# Patient Record
Sex: Female | Born: 1975 | Race: Black or African American | Hispanic: No | Marital: Single | Smoking: Current every day smoker
Health system: Southern US, Community
[De-identification: ages and names within clinical notes are randomized; demographics above are authoritative.]

## PROBLEM LIST (undated history)

## (undated) DIAGNOSIS — J45909 Unspecified asthma, uncomplicated: Secondary | ICD-10-CM

---

## 1998-01-06 ENCOUNTER — Emergency Department (HOSPITAL_COMMUNITY): Admission: EM | Admit: 1998-01-06 | Discharge: 1998-01-07 | Payer: Self-pay | Admitting: Internal Medicine

## 1999-11-23 ENCOUNTER — Emergency Department (HOSPITAL_COMMUNITY): Admission: EM | Admit: 1999-11-23 | Discharge: 1999-11-23 | Payer: Self-pay | Admitting: Emergency Medicine

## 1999-11-23 ENCOUNTER — Encounter: Payer: Self-pay | Admitting: Emergency Medicine

## 2000-10-06 ENCOUNTER — Encounter: Payer: Self-pay | Admitting: Emergency Medicine

## 2000-10-06 ENCOUNTER — Emergency Department (HOSPITAL_COMMUNITY): Admission: EM | Admit: 2000-10-06 | Discharge: 2000-10-07 | Payer: Self-pay | Admitting: Unknown Physician Specialty

## 2000-10-08 ENCOUNTER — Emergency Department (HOSPITAL_COMMUNITY): Admission: EM | Admit: 2000-10-08 | Discharge: 2000-10-08 | Payer: Self-pay | Admitting: Emergency Medicine

## 2001-05-15 ENCOUNTER — Emergency Department (HOSPITAL_COMMUNITY): Admission: EM | Admit: 2001-05-15 | Discharge: 2001-05-15 | Payer: Self-pay | Admitting: Emergency Medicine

## 2001-05-15 ENCOUNTER — Encounter: Payer: Self-pay | Admitting: Emergency Medicine

## 2002-06-30 ENCOUNTER — Emergency Department (HOSPITAL_COMMUNITY): Admission: EM | Admit: 2002-06-30 | Discharge: 2002-07-01 | Payer: Self-pay | Admitting: Emergency Medicine

## 2002-11-25 ENCOUNTER — Inpatient Hospital Stay (HOSPITAL_COMMUNITY): Admission: AD | Admit: 2002-11-25 | Discharge: 2002-11-25 | Payer: Self-pay | Admitting: Family Medicine

## 2002-11-26 ENCOUNTER — Encounter: Payer: Self-pay | Admitting: Family Medicine

## 2002-12-06 ENCOUNTER — Inpatient Hospital Stay (HOSPITAL_COMMUNITY): Admission: AD | Admit: 2002-12-06 | Discharge: 2002-12-06 | Payer: Self-pay | Admitting: *Deleted

## 2003-04-21 ENCOUNTER — Inpatient Hospital Stay (HOSPITAL_COMMUNITY): Admission: AD | Admit: 2003-04-21 | Discharge: 2003-04-22 | Payer: Self-pay | Admitting: Family Medicine

## 2003-09-29 ENCOUNTER — Emergency Department (HOSPITAL_COMMUNITY): Admission: EM | Admit: 2003-09-29 | Discharge: 2003-09-29 | Payer: Self-pay | Admitting: Emergency Medicine

## 2003-11-27 ENCOUNTER — Inpatient Hospital Stay (HOSPITAL_COMMUNITY): Admission: AD | Admit: 2003-11-27 | Discharge: 2003-11-27 | Payer: Self-pay | Admitting: *Deleted

## 2004-07-19 ENCOUNTER — Emergency Department (HOSPITAL_COMMUNITY): Admission: EM | Admit: 2004-07-19 | Discharge: 2004-07-19 | Payer: Self-pay | Admitting: Emergency Medicine

## 2004-12-30 ENCOUNTER — Emergency Department (HOSPITAL_COMMUNITY): Admission: EM | Admit: 2004-12-30 | Discharge: 2004-12-30 | Payer: Self-pay | Admitting: *Deleted

## 2006-07-18 ENCOUNTER — Emergency Department (HOSPITAL_COMMUNITY): Admission: EM | Admit: 2006-07-18 | Discharge: 2006-07-18 | Payer: Self-pay | Admitting: Emergency Medicine

## 2006-12-14 ENCOUNTER — Emergency Department (HOSPITAL_COMMUNITY): Admission: EM | Admit: 2006-12-14 | Discharge: 2006-12-14 | Payer: Self-pay | Admitting: Emergency Medicine

## 2007-01-15 ENCOUNTER — Emergency Department (HOSPITAL_COMMUNITY): Admission: EM | Admit: 2007-01-15 | Discharge: 2007-01-15 | Payer: Self-pay | Admitting: Emergency Medicine

## 2007-02-12 ENCOUNTER — Ambulatory Visit: Payer: Self-pay | Admitting: Internal Medicine

## 2007-02-13 ENCOUNTER — Ambulatory Visit: Payer: Self-pay | Admitting: *Deleted

## 2007-02-27 ENCOUNTER — Ambulatory Visit: Payer: Self-pay | Admitting: Family Medicine

## 2007-06-01 ENCOUNTER — Ambulatory Visit: Payer: Self-pay | Admitting: Family Medicine

## 2007-06-02 ENCOUNTER — Encounter (INDEPENDENT_AMBULATORY_CARE_PROVIDER_SITE_OTHER): Payer: Self-pay | Admitting: Family Medicine

## 2007-07-15 ENCOUNTER — Emergency Department (HOSPITAL_COMMUNITY): Admission: EM | Admit: 2007-07-15 | Discharge: 2007-07-16 | Payer: Self-pay | Admitting: Emergency Medicine

## 2007-09-30 ENCOUNTER — Emergency Department (HOSPITAL_COMMUNITY): Admission: EM | Admit: 2007-09-30 | Discharge: 2007-10-01 | Payer: Self-pay | Admitting: Emergency Medicine

## 2008-05-09 ENCOUNTER — Emergency Department (HOSPITAL_COMMUNITY): Admission: EM | Admit: 2008-05-09 | Discharge: 2008-05-09 | Payer: Self-pay | Admitting: Emergency Medicine

## 2008-12-06 ENCOUNTER — Emergency Department (HOSPITAL_COMMUNITY): Admission: EM | Admit: 2008-12-06 | Discharge: 2008-12-06 | Payer: Self-pay | Admitting: Emergency Medicine

## 2009-10-13 ENCOUNTER — Emergency Department (HOSPITAL_COMMUNITY): Admission: EM | Admit: 2009-10-13 | Discharge: 2009-10-13 | Payer: Self-pay | Admitting: Emergency Medicine

## 2009-10-17 ENCOUNTER — Emergency Department (HOSPITAL_COMMUNITY): Admission: EM | Admit: 2009-10-17 | Discharge: 2009-10-17 | Payer: Self-pay | Admitting: Emergency Medicine

## 2009-12-28 ENCOUNTER — Emergency Department (HOSPITAL_COMMUNITY): Admission: EM | Admit: 2009-12-28 | Discharge: 2009-12-28 | Payer: Self-pay | Admitting: Emergency Medicine

## 2010-10-04 LAB — URINALYSIS, ROUTINE W REFLEX MICROSCOPIC
Bilirubin Urine: NEGATIVE
Glucose, UA: NEGATIVE mg/dL
Glucose, UA: NEGATIVE mg/dL
Ketones, ur: 15 mg/dL — AB
Ketones, ur: NEGATIVE mg/dL
Nitrite: NEGATIVE
Protein, ur: 30 mg/dL — AB
Urobilinogen, UA: 1 mg/dL (ref 0.0–1.0)
pH: 8.5 — ABNORMAL HIGH (ref 5.0–8.0)
pH: 8.5 — ABNORMAL HIGH (ref 5.0–8.0)

## 2010-10-04 LAB — DIFFERENTIAL
Basophils Relative: 0 % (ref 0–1)
Lymphocytes Relative: 24 % (ref 12–46)
Lymphs Abs: 2.5 10*3/uL (ref 0.7–4.0)
Neutrophils Relative %: 66 % (ref 43–77)

## 2010-10-04 LAB — CBC
Hemoglobin: 13.5 g/dL (ref 12.0–15.0)
RBC: 4.27 MIL/uL (ref 3.87–5.11)
WBC: 10.3 10*3/uL (ref 4.0–10.5)

## 2010-10-04 LAB — GC/CHLAMYDIA PROBE AMP, GENITAL
Chlamydia, DNA Probe: NEGATIVE
GC Probe Amp, Genital: NEGATIVE

## 2010-10-04 LAB — POCT PREGNANCY, URINE: Preg Test, Ur: NEGATIVE

## 2010-10-04 LAB — WET PREP, GENITAL

## 2010-10-04 LAB — URINE MICROSCOPIC-ADD ON

## 2010-10-06 LAB — CBC
HCT: 38.3 % (ref 36.0–46.0)
MCHC: 33.5 g/dL (ref 30.0–36.0)
MCV: 93.4 fL (ref 78.0–100.0)
RBC: 4.1 MIL/uL (ref 3.87–5.11)
WBC: 9.7 10*3/uL (ref 4.0–10.5)

## 2010-10-06 LAB — BASIC METABOLIC PANEL
CO2: 24 mEq/L (ref 19–32)
GFR calc non Af Amer: >60 mL/min (ref >60)

## 2010-10-06 LAB — URINALYSIS, ROUTINE W REFLEX MICROSCOPIC
Nitrite: NEGATIVE
Protein, ur: NEGATIVE mg/dL
Specific Gravity, Urine: 1.033 — ABNORMAL HIGH (ref 1.005–1.030)
Urobilinogen, UA: 1 mg/dL (ref 0.0–1.0)

## 2010-10-06 LAB — RAPID URINE DRUG SCREEN, HOSP PERFORMED
Benzodiazepines: NOT DETECTED
Opiates: NOT DETECTED
Tetrahydrocannabinol: POSITIVE — AB

## 2010-10-06 LAB — POCT PREGNANCY, URINE: Preg Test, Ur: NEGATIVE

## 2010-10-06 LAB — TROPONIN I: Troponin I: 0.01 ng/mL (ref 0.00–0.06)

## 2010-12-16 ENCOUNTER — Emergency Department (HOSPITAL_COMMUNITY)
Admission: EM | Admit: 2010-12-16 | Discharge: 2010-12-16 | Disposition: A | Discharge status: Home / Self Care | Payer: Self-pay | Attending: Emergency Medicine | Admitting: Emergency Medicine

## 2010-12-16 DIAGNOSIS — R109 Unspecified abdominal pain: Secondary | ICD-10-CM | POA: Insufficient documentation

## 2010-12-16 DIAGNOSIS — R112 Nausea with vomiting, unspecified: Secondary | ICD-10-CM | POA: Insufficient documentation

## 2010-12-16 DIAGNOSIS — L298 Other pruritus: Secondary | ICD-10-CM | POA: Insufficient documentation

## 2010-12-16 DIAGNOSIS — R42 Dizziness and giddiness: Secondary | ICD-10-CM | POA: Insufficient documentation

## 2010-12-16 DIAGNOSIS — W57XXXA Bitten or stung by nonvenomous insect and other nonvenomous arthropods, initial encounter: Secondary | ICD-10-CM | POA: Insufficient documentation

## 2010-12-16 DIAGNOSIS — S30860A Insect bite (nonvenomous) of lower back and pelvis, initial encounter: Secondary | ICD-10-CM | POA: Insufficient documentation

## 2010-12-16 DIAGNOSIS — L2989 Other pruritus: Secondary | ICD-10-CM | POA: Insufficient documentation

## 2011-04-11 LAB — URINALYSIS, ROUTINE W REFLEX MICROSCOPIC
Bilirubin Urine: NEGATIVE
Glucose, UA: NEGATIVE
Hgb urine dipstick: NEGATIVE
Nitrite: NEGATIVE
Urobilinogen, UA: 0.2

## 2011-04-11 LAB — WET PREP, GENITAL: Yeast Wet Prep HPF POC: NONE SEEN

## 2011-04-11 LAB — POCT PREGNANCY, URINE
Operator id: 173591
Preg Test, Ur: NEGATIVE

## 2011-04-11 LAB — GC/CHLAMYDIA PROBE AMP, GENITAL: GC Probe Amp, Genital: NEGATIVE

## 2011-04-18 LAB — URINALYSIS, ROUTINE W REFLEX MICROSCOPIC: Nitrite: POSITIVE — AB

## 2011-04-18 LAB — URINE MICROSCOPIC-ADD ON

## 2011-04-22 LAB — URINALYSIS, ROUTINE W REFLEX MICROSCOPIC
Leukocytes, UA: NEGATIVE
Specific Gravity, Urine: 1.029
Urobilinogen, UA: 0.2

## 2011-04-22 LAB — I-STAT 8, (EC8 V) (CONVERTED LAB)
BUN: 17
Chloride: 110
Glucose, Bld: 89
HCT: 44
Hemoglobin: 15
Operator id: 151321
Potassium: 3.8
Sodium: 138

## 2011-04-22 LAB — POCT I-STAT CREATININE: Creatinine, Ser: 0.9

## 2011-04-22 LAB — CBC
HCT: 37.7
Hemoglobin: 12.9
MCHC: 34.3
MCV: 92.3
RBC: 4.08

## 2011-04-22 LAB — DIFFERENTIAL
Basophils Relative: 0
Eosinophils Absolute: 0.1
Eosinophils Relative: 1
Lymphs Abs: 0.6 — ABNORMAL LOW
Monocytes Absolute: 0.3
Monocytes Relative: 3
Neutrophils Relative %: 90 — ABNORMAL HIGH

## 2011-04-22 LAB — URINE MICROSCOPIC-ADD ON

## 2011-05-04 LAB — COMPREHENSIVE METABOLIC PANEL
AST: 22
Albumin: 3.4 — ABNORMAL LOW
BUN: 9
Chloride: 104
Creatinine, Ser: 0.83
GFR calc Af Amer: >60
Potassium: 3.4 — ABNORMAL LOW
Total Bilirubin: 0.9
Total Protein: 6.8

## 2011-05-04 LAB — WET PREP, GENITAL: WBC, Wet Prep HPF POC: NONE SEEN

## 2011-05-04 LAB — URINALYSIS, ROUTINE W REFLEX MICROSCOPIC
Bilirubin Urine: NEGATIVE
Glucose, UA: NEGATIVE
Hgb urine dipstick: NEGATIVE
Specific Gravity, Urine: 1.018
pH: 8

## 2011-05-04 LAB — DIFFERENTIAL
Basophils Absolute: 0.3 — ABNORMAL HIGH
Eosinophils Relative: 3
Lymphocytes Relative: 21
Monocytes Absolute: 0.6
Monocytes Relative: 7
Neutro Abs: 5.5

## 2011-05-04 LAB — CBC
MCV: 93.5
Platelets: 210
RDW: 13.3
WBC: 8.4

## 2011-05-04 LAB — URINE MICROSCOPIC-ADD ON

## 2011-05-04 LAB — RAPID URINE DRUG SCREEN, HOSP PERFORMED
Barbiturates: NOT DETECTED
Cocaine: POSITIVE — AB
Opiates: NOT DETECTED
Tetrahydrocannabinol: POSITIVE — AB

## 2011-05-04 LAB — RPR: RPR Ser Ql: NONREACTIVE

## 2011-05-04 LAB — GC/CHLAMYDIA PROBE AMP, GENITAL
Chlamydia, DNA Probe: NEGATIVE
GC Probe Amp, Genital: NEGATIVE

## 2011-05-22 IMAGING — CR DG RIBS W/ CHEST 3+V*L*
4 series · 4 of 4 positions shown · non-contrast
Comparison: 07/15/2007.

CLINICAL DATA: Status post fall.  The left anterior rib pain.

LEFT RIBS AND CHEST - 3+ VIEW

[w chest pa]
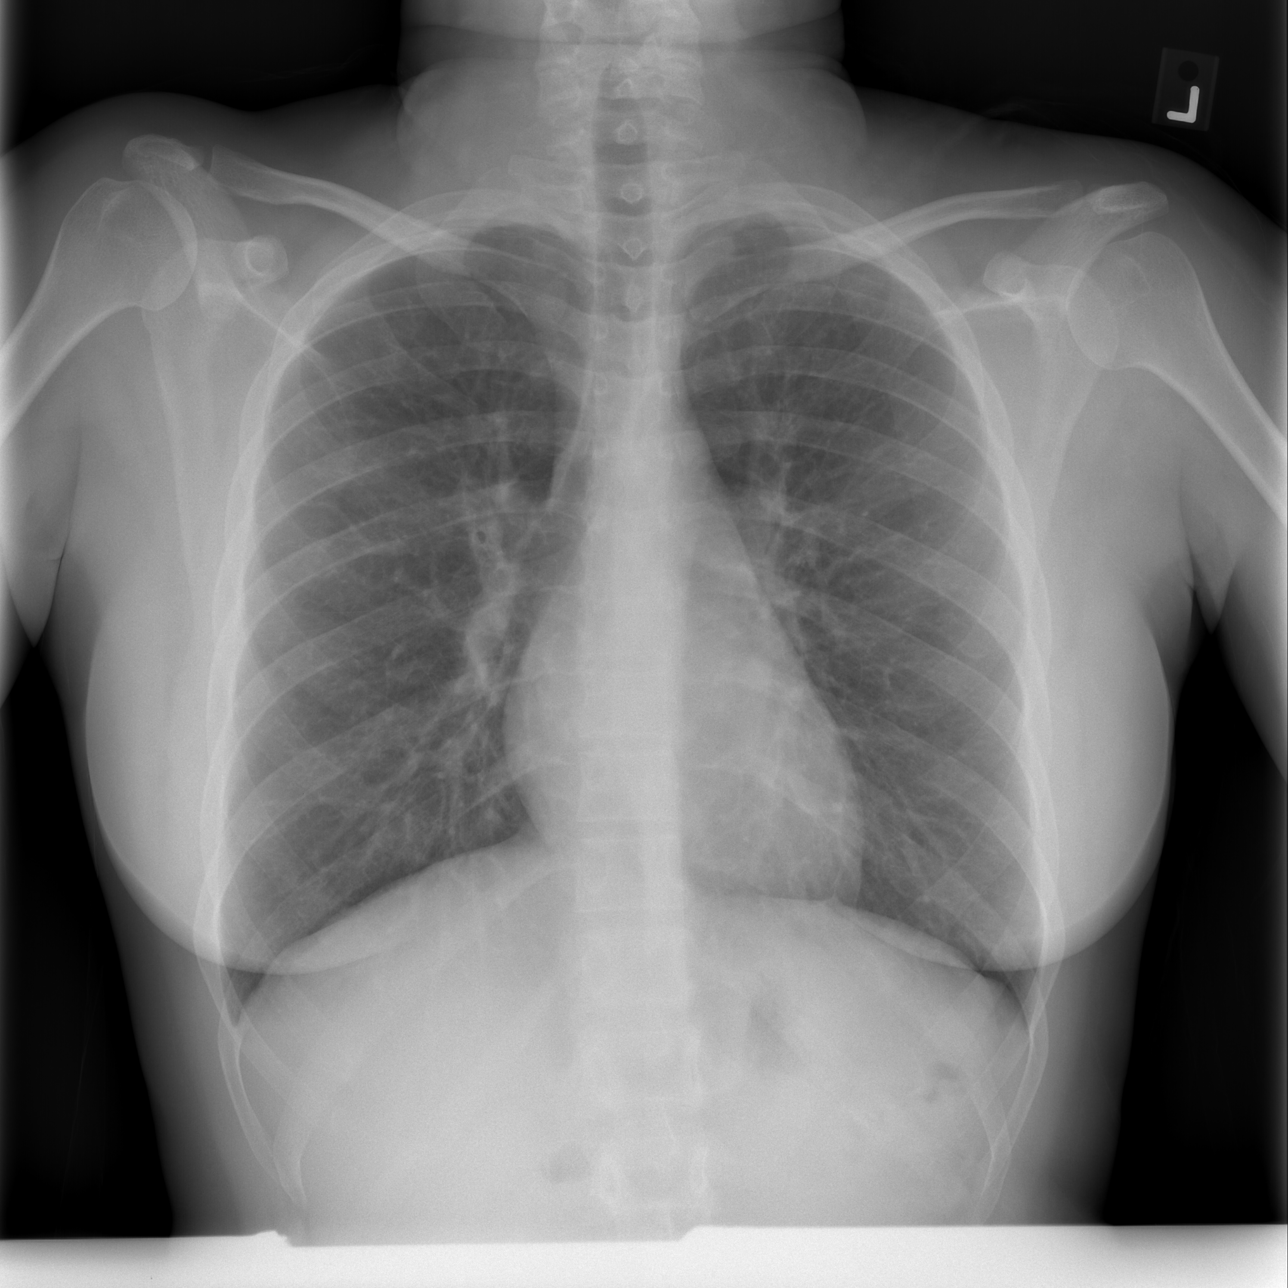

[w ribs ap/pa upper left *]
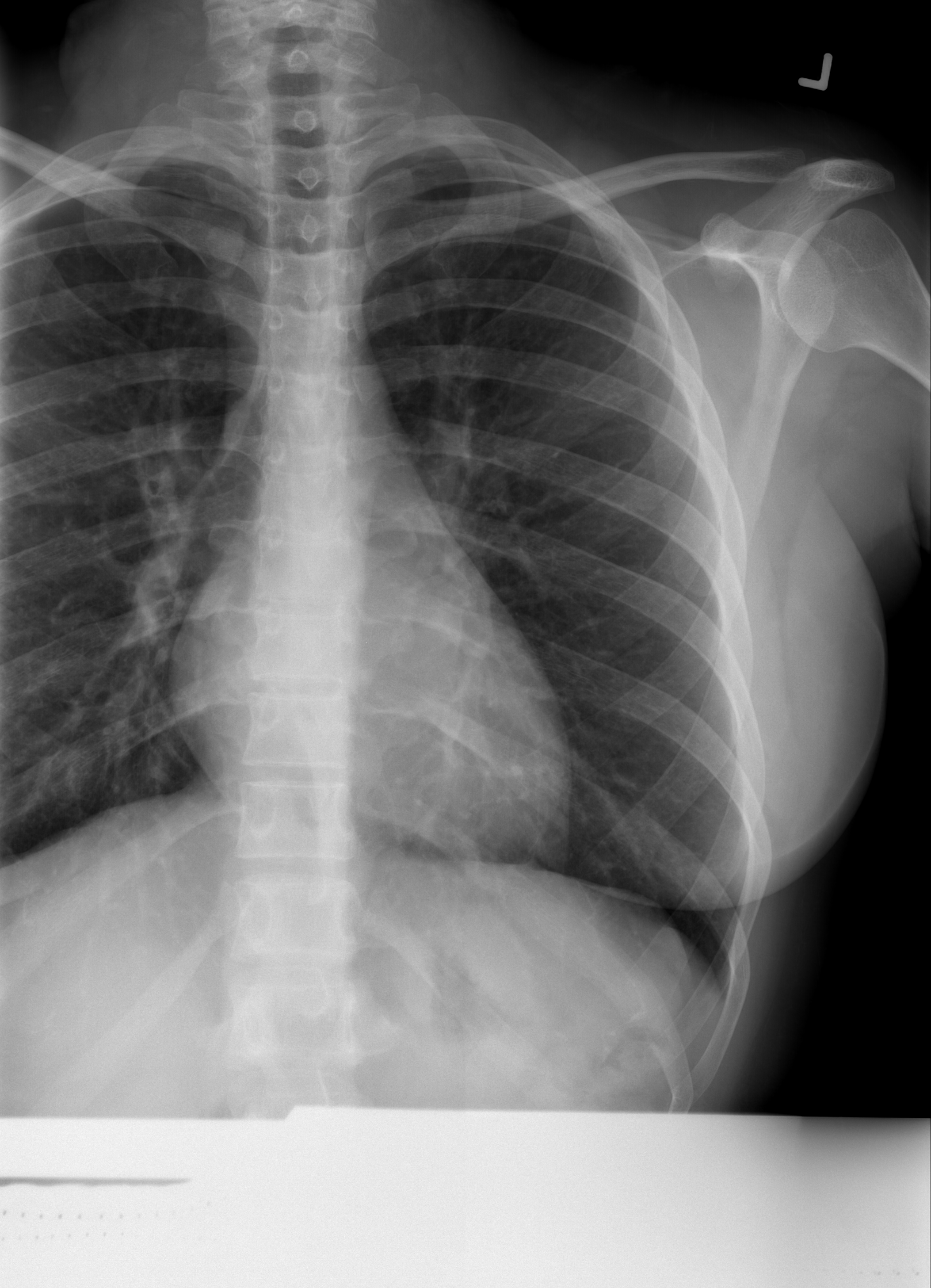

[w ribs ap/pa lower left *]
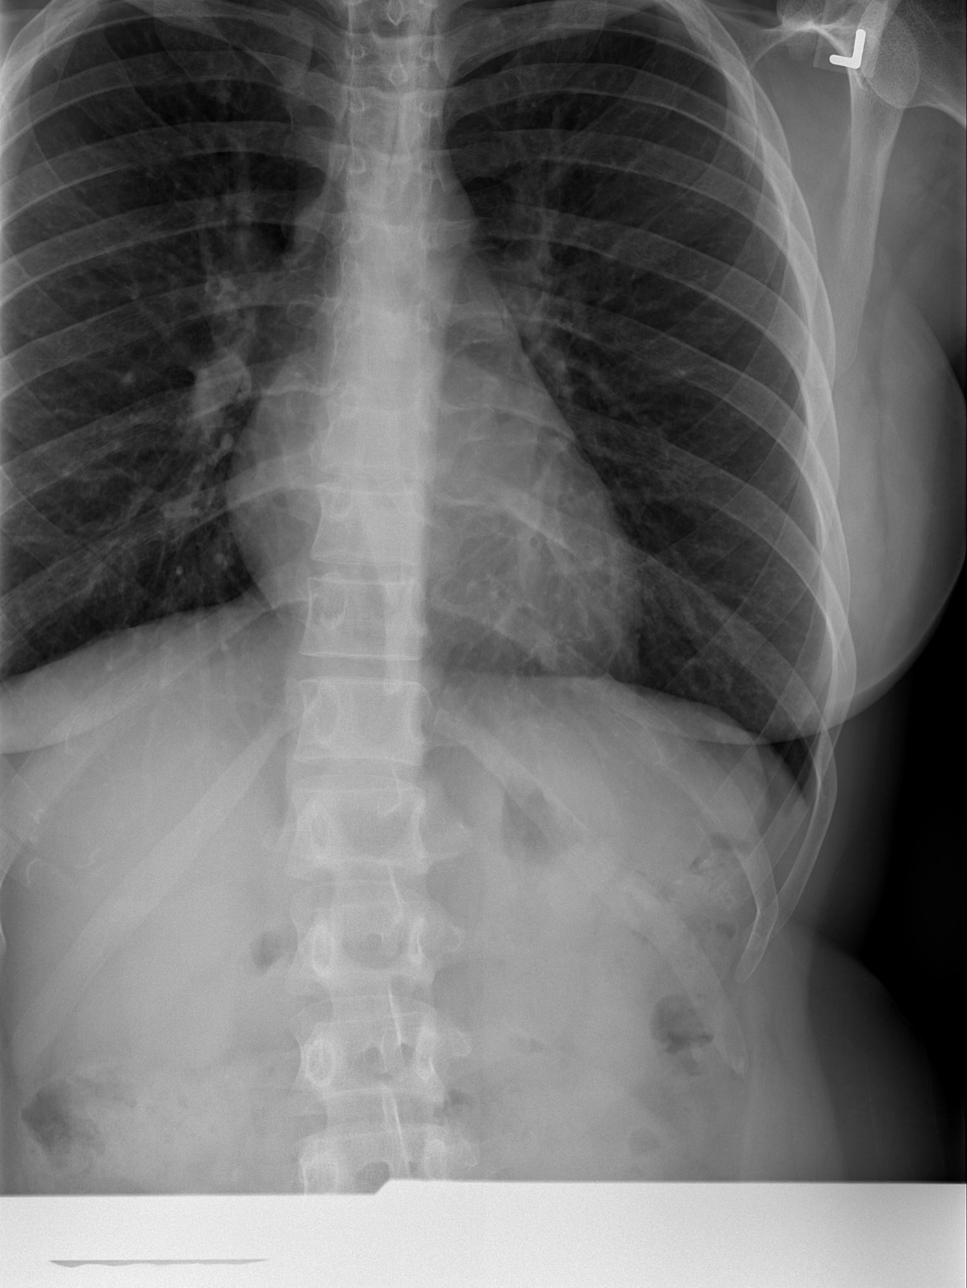

[w ribs oblique left *]
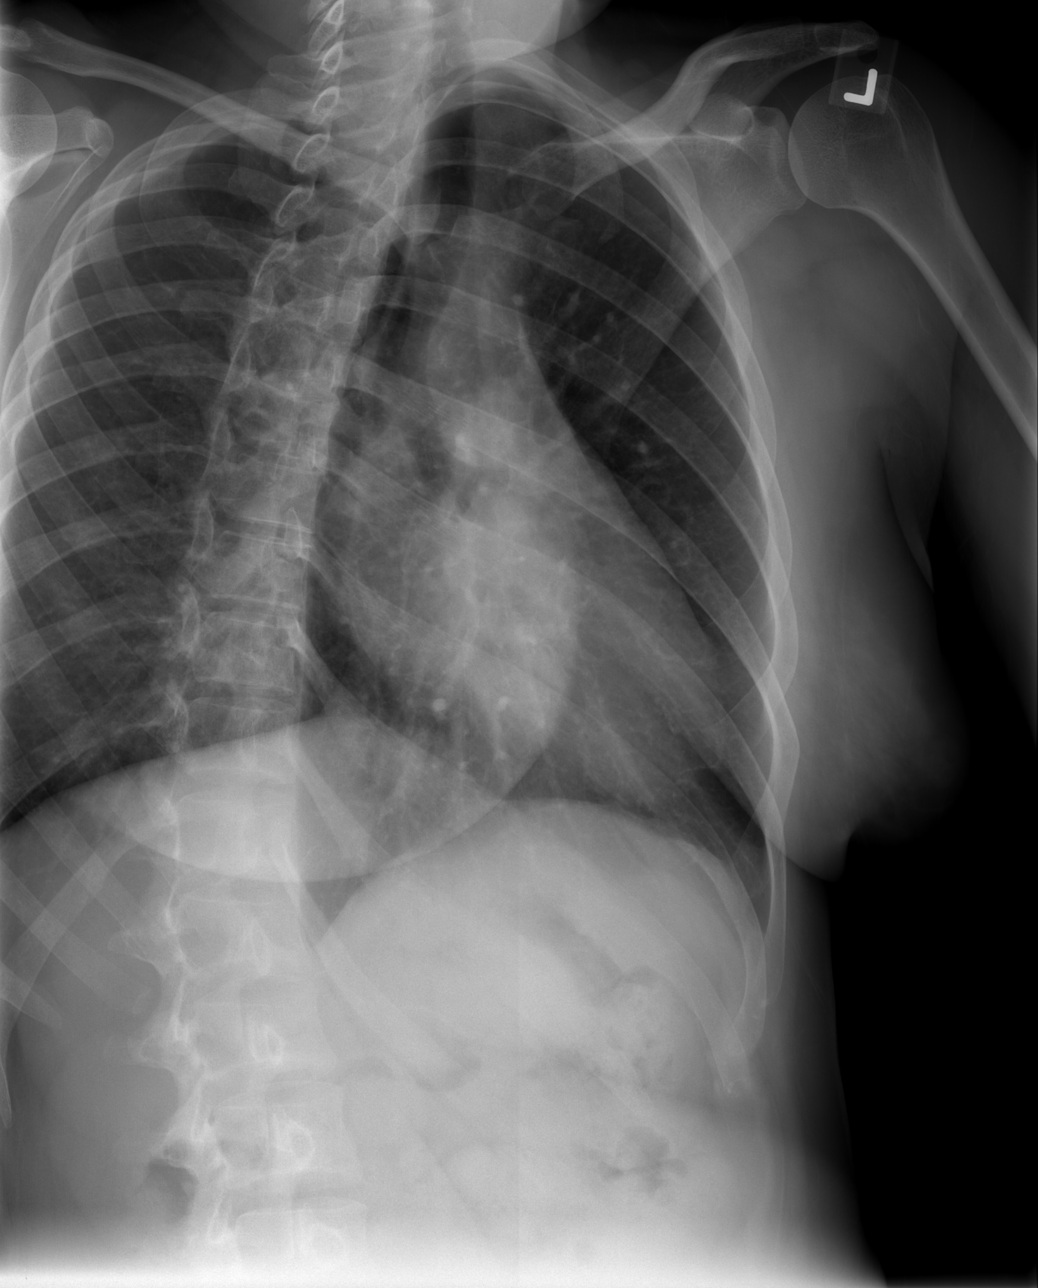

[4 of 4 positions shown; findings below may reference images not displayed]

FINDINGS: The cardiopericardial silhouette is normal in size.
Lungs are clear.  Dedicated views of the left ribs demonstrate no
evidence for fracture.
IMPRESSION: Negative chest and left ribs.

## 2011-05-22 IMAGING — CR DG KNEE COMPLETE 4+V*R*
4 series · 4 of 4 positions shown · non-contrast
Comparison: None available.

CLINICAL DATA: Status post fall.  Right knee pain.

RIGHT KNEE - COMPLETE 4+ VIEW

[t knee ap right]
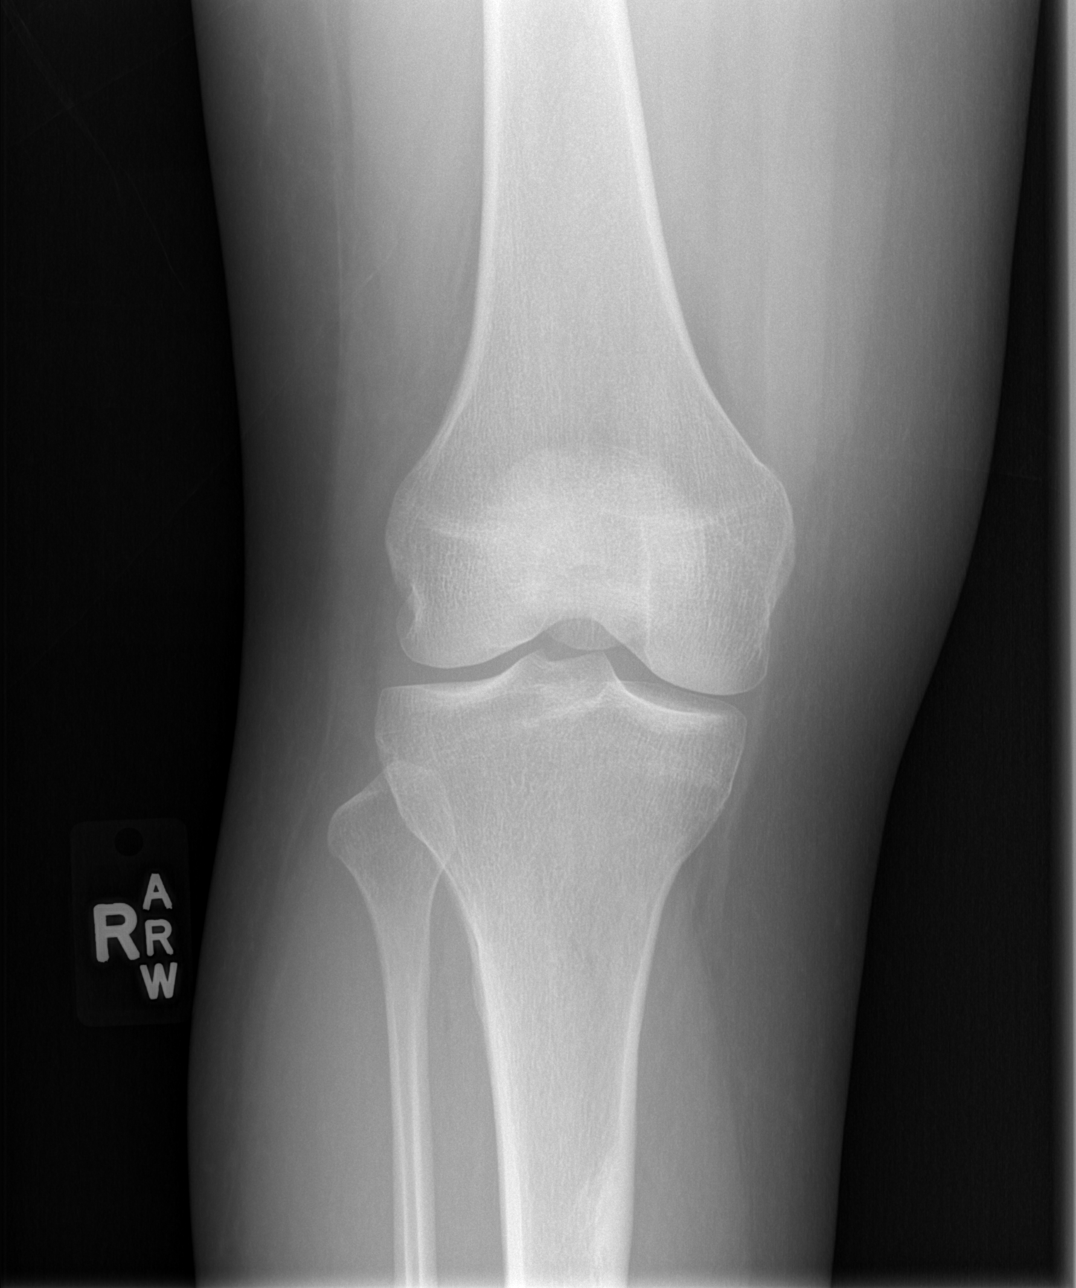

[t knee oblique right (1 of 2)]
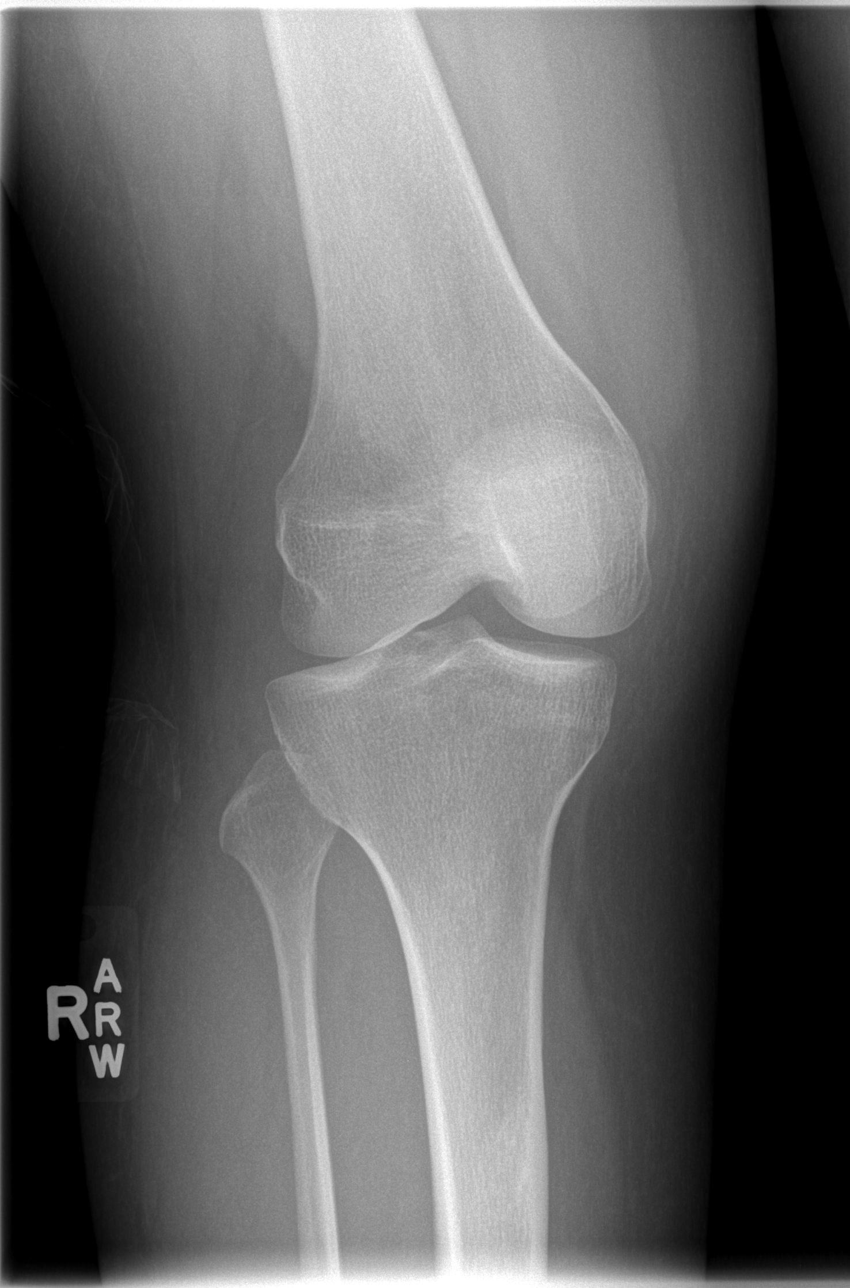

[t knee oblique right (2 of 2)]
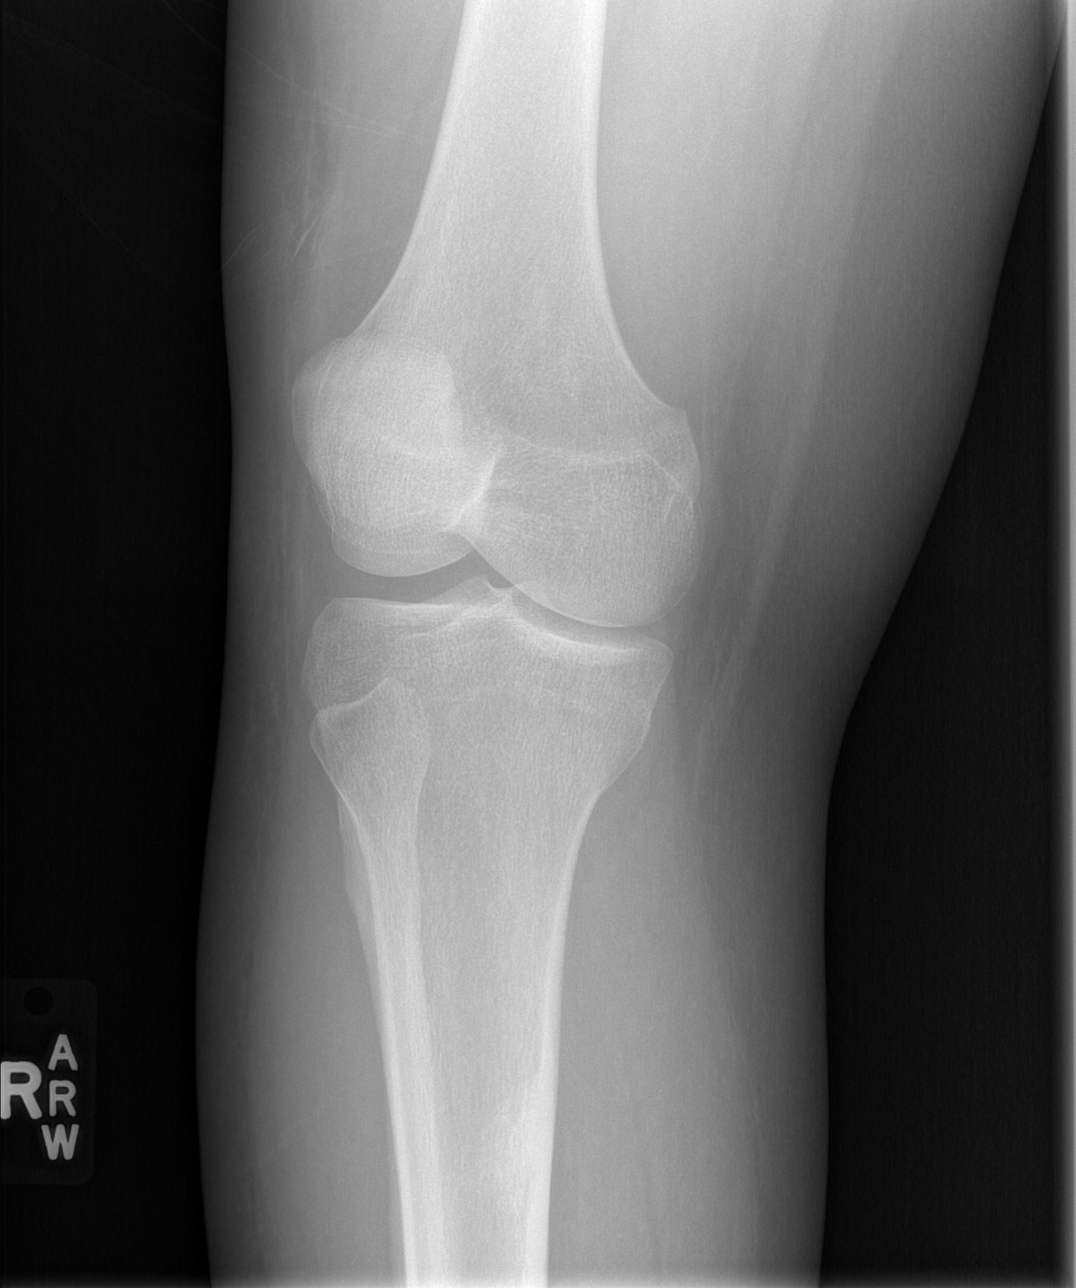

[t knee lat right]
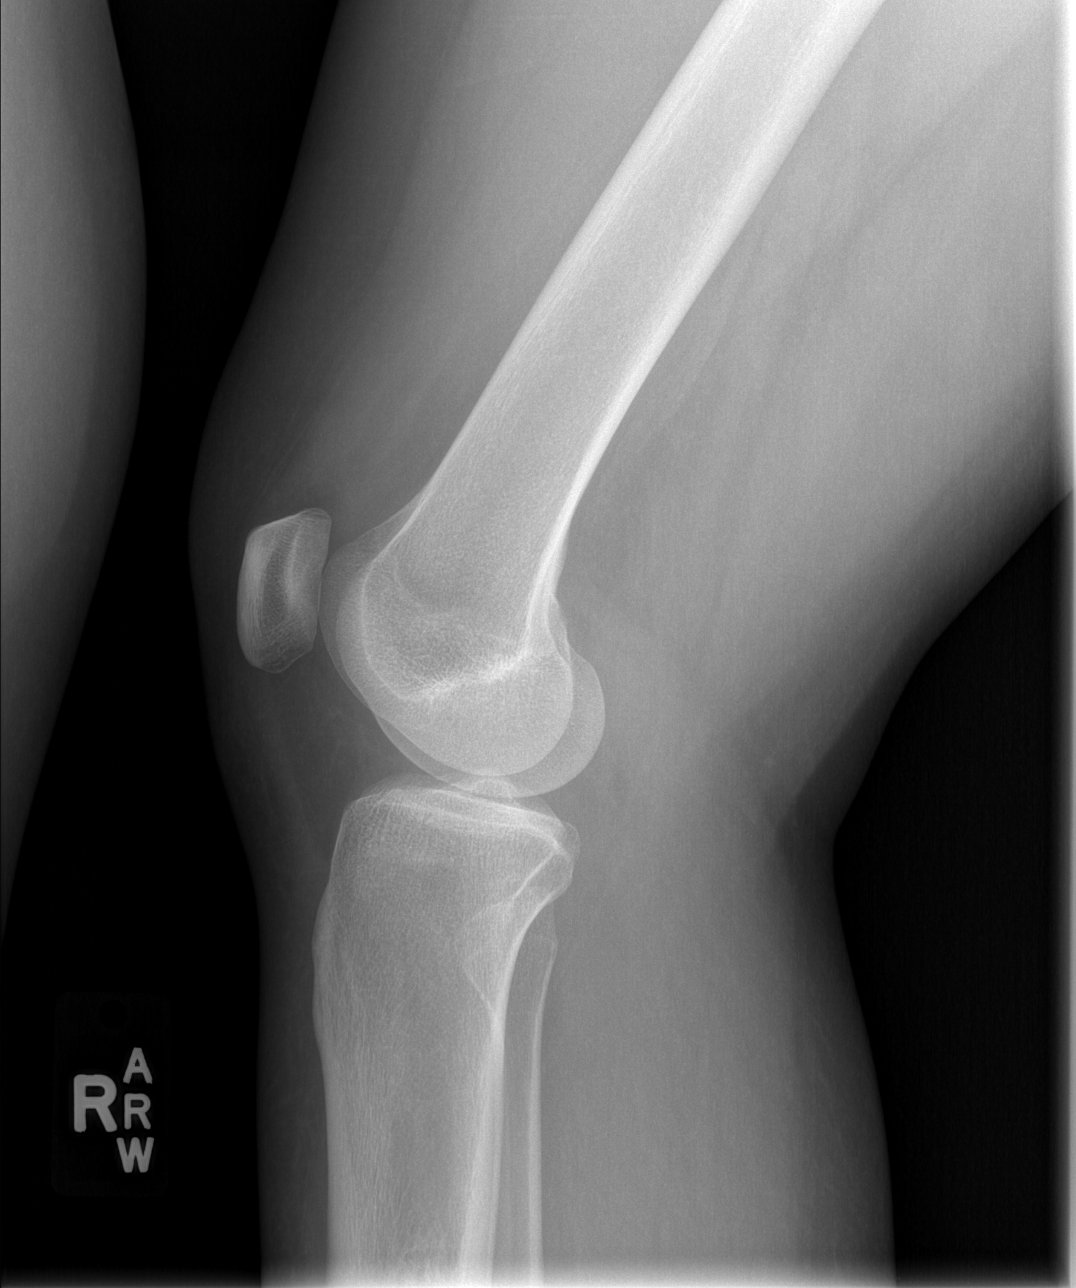

[4 of 4 positions shown; findings below may reference images not displayed]

FINDINGS: The right knee is located.  No significant effusion is
present.  No acute bone or soft tissue abnormality is present.
IMPRESSION: Negative right knee.

## 2011-09-28 ENCOUNTER — Emergency Department (INDEPENDENT_AMBULATORY_CARE_PROVIDER_SITE_OTHER)
Admission: EM | Admit: 2011-09-28 | Discharge: 2011-09-28 | Disposition: A | Discharge status: Home / Self Care | Payer: Self-pay | Source: Home / Self Care

## 2011-09-28 ENCOUNTER — Encounter (HOSPITAL_COMMUNITY): Payer: Self-pay | Admitting: Emergency Medicine

## 2011-09-28 DIAGNOSIS — R109 Unspecified abdominal pain: Secondary | ICD-10-CM

## 2011-09-28 DIAGNOSIS — N390 Urinary tract infection, site not specified: Secondary | ICD-10-CM

## 2011-09-28 LAB — POCT URINALYSIS DIP (DEVICE)
Glucose, UA: NEGATIVE mg/dL
Nitrite: NEGATIVE
Protein, ur: NEGATIVE mg/dL
Specific Gravity, Urine: 1.02 (ref 1.005–1.030)
Urobilinogen, UA: 1 mg/dL (ref 0.0–1.0)

## 2011-09-28 LAB — WET PREP, GENITAL

## 2011-09-28 LAB — CBC
Hemoglobin: 13.6 g/dL (ref 12.0–15.0)
MCH: 30.4 pg (ref 26.0–34.0)
MCHC: 34.1 g/dL (ref 30.0–36.0)

## 2011-09-28 LAB — COMPREHENSIVE METABOLIC PANEL
ALT: 17 U/L (ref 0–35)
AST: 22 U/L (ref 0–37)
CO2: 24 mEq/L (ref 19–32)
Calcium: 9.6 mg/dL (ref 8.4–10.5)
Chloride: 105 mEq/L (ref 96–112)
GFR calc non Af Amer: >90 mL/min (ref >90)
Sodium: 139 mEq/L (ref 135–145)
Total Bilirubin: 0.7 mg/dL (ref 0.3–1.2)

## 2011-09-28 LAB — POCT PREGNANCY, URINE: Preg Test, Ur: NEGATIVE

## 2011-09-28 MED ORDER — HYDROCODONE-ACETAMINOPHEN 5-500 MG PO TABS: 1.0000 | ORAL_TABLET | Freq: Four times a day (QID) | ORAL | Status: AC | PRN

## 2011-09-28 MED ORDER — ONDANSETRON HCL 8 MG PO TABS: 8.0000 mg | ORAL_TABLET | Freq: Three times a day (TID) | ORAL | Status: DC | PRN

## 2011-09-28 MED ORDER — CEPHALEXIN 500 MG PO CAPS: 500.0000 mg | ORAL_CAPSULE | Freq: Two times a day (BID) | ORAL | Status: AC

## 2011-09-28 NOTE — ED Notes (Signed)
HERE WITH RIGHT ABDOMINAL INTERMITT PAIN WITH MOVEMENT RADIATING TO LOWER BACK,NAUSEA THAT STARTED YESTERDAY.PT STATES BACK PAIN IS CONSTANT AND HAS BEEN ONGOING BUT FLANK PAIN IS NEW.PT ALSO STATES Monday SHE HAD EPISODE OF SYNCOPE WITNESSED BY FAMILY.STATES FAINTING LASTED 5 MN WITHOUT DIZZINESS.NO VOMITNG BUT HAS DRY HEAVS

## 2011-09-28 NOTE — ED Provider Notes (Signed)
Kristen Zimmerman is a 36 y.o. female who presents to Urgent Care today for abdominal pain starting Monday.  Pain is located in the right flank to upper abdomen.  Pain does not radiate. Patient has noted no fevers or chills but has noted occasional vomiting and pressure with urination. She denies any vaginal discharge or bleeding. Her last menstrual period was February 8. She's been taking ibuprofen for pain which has helped a little.  She has missed work which is unusual for her.  She denies any weakness or numbness or bowel or bladder dysfunction.  She is eating and drinking normally, and producing urine.     PMH reviewed. Patient denies any abdominal surgical history.   ROS as above otherwise neg Medications reviewed. Taking ibuprofen   Exam:  BP 121/77  Pulse 77  Temp(Src) 99.3 F (37.4 C) (Oral)  Resp 16  SpO2 100%  LMP 09/23/2011 Gen: Well NAD, nontoxic appearing HEENT: EOMI,  MMM Lungs: CTABL Nl WOB Heart: RRR no MRG Abd: NABS, NT, ND no CV angle tenderness Exts: Non edematous BL  LE, warm and well perfused.  GYN: Normal external genitalia. Vaginal canal has white discharge. Cervix is normal appearing. No cervical motion tenderness, adnexal fullness or tenderness. Uterus is normal size and position.    Results for orders placed during the hospital encounter of 09/28/11  POCT URINALYSIS DIP (DEVICE)      Component Value Range   Glucose, UA NEGATIVE  NEGATIVE (mg/dL)   Bilirubin Urine NEGATIVE  NEGATIVE    Ketones, ur NEGATIVE  NEGATIVE (mg/dL)   Specific Gravity, Urine 1.020  1.005 - 1.030    Hgb urine dipstick NEGATIVE  NEGATIVE    pH 7.5  5.0 - 8.0    Protein, ur NEGATIVE  NEGATIVE (mg/dL)   Urobilinogen, UA 1.0  0.0 - 1.0 (mg/dL)   Nitrite NEGATIVE  NEGATIVE    Leukocytes, UA TRACE (*) NEGATIVE   POCT PREGNANCY, URINE      Component Value Range   Preg Test, Ur NEGATIVE  NEGATIVE   WET PREP, GENITAL      Component Value Range   Yeast Wet Prep HPF POC NONE SEEN   NONE SEEN    Trich, Wet Prep NONE SEEN  NONE SEEN    Clue Cells Wet Prep HPF POC MODERATE (*) NONE SEEN    WBC, Wet Prep HPF POC FEW (*) NONE SEEN   CBC      Component Value Range   WBC 11.0 (*) 4.0 - 10.5 (K/uL)   RBC 4.47  3.87 - 5.11 (MIL/uL)   Hemoglobin 13.6  12.0 - 15.0 (g/dL)   HCT 16.1  09.6 - 04.5 (%)   MCV 89.3  78.0 - 100.0 (fL)   MCH 30.4  26.0 - 34.0 (pg)   MCHC 34.1  30.0 - 36.0 (g/dL)   RDW 40.9  81.1 - 91.4 (%)   Platelets 246  150 - 400 (K/uL)     Assessment and Plan: 36 year old woman with abdominal pain and vomiting.   Likely multifactorial. I identified several possibilities with laboratory testing. I think however serious abdominal process such as appendicitis cholecystitis colitis or diverticulitis is less likely, as the patient nontoxic appearing in her abdominal and genital exam is benign.  I think the most likely cause of her symptoms is viral gastroenteritis as this is common in the community currently the patient has vomiting associated with her pain.  1) urinary tract infection: I don't think pyelonephritis based on absence  of white count or costovertebral angle tenderness to percussion.  Plan to treat with Keflex. 2) asymptomatic bacterial vaginosis: Patient was unaware of discharge and I think that bacterial vaginosis preceded this episode of abdominal pain and vomiting.  We'll not treat initially. The patient is continuing to be symptomatic we'll offer treatment.  I have ordered a CMP and gonorrhea chlamydia but these are pending currently. Plan to call patient with results tomorrow morning. If patient remains symptomatic will advise return to clinic or emergency room. Discussed warning signs and symptoms with patient and provided doesn't handout. Additionally we'll treat symptoms with Zofran and small amount of hydrocodone. Patient expresses understanding.     Rodolph Bong, MD 09/28/11 [phone]

## 2011-09-28 NOTE — Discharge Instructions (Signed)
Thank you for coming in today. Please take the keflex twice a day for uti.  Use Vicodin as needed for pain.  If you do not get better go to the ER or come back on Friday.  If you are getting worse or have uncontrollable vomiting, extreme pain, stop passing gas or pooping with vomiting, or have high fever go to the emergency room.  Use the Zofran as needed for nausea and vomiting.    Abdominal Pain (Nonspecific) Your exam might not show the exact reason you have abdominal pain. Since there are many different causes of abdominal pain, another checkup and more tests may be needed. It is very important to follow up for lasting (persistent) or worsening symptoms. A possible cause of abdominal pain in any person who still has his or her appendix is acute appendicitis. Appendicitis is often hard to diagnose. Normal blood tests, urine tests, ultrasound, and CT scans do not completely rule out early appendicitis or other causes of abdominal pain. Sometimes, only the changes that happen over time will allow appendicitis and other causes of abdominal pain to be determined. Other potential problems that may require surgery may also take time to become more apparent. Because of this, it is important that you follow all of the instructions below. HOME CARE INSTRUCTIONS   Rest as much as possible.   Do not eat solid food until your pain is gone.   While adults or children have pain: A diet of water, weak decaffeinated tea, broth or bouillon, gelatin, oral rehydration solutions (ORS), frozen ice pops, or ice chips may be helpful.   When pain is gone in adults or children: Start a light diet (dry toast, crackers, applesauce, or white rice). Increase the diet slowly as long as it does not bother you. Eat no dairy products (including cheese and eggs) and no spicy, fatty, fried, or high-fiber foods.   Use no alcohol, caffeine, or cigarettes.   Take your regular medicines unless your caregiver told you not to.    Take any prescribed medicine as directed.   Only take over-the-counter or prescription medicines for pain, discomfort, or fever as directed by your caregiver. Do not give aspirin to children.  If your caregiver has given you a follow-up appointment, it is very important to keep that appointment. Not keeping the appointment could result in a permanent injury and/or lasting (chronic) pain and/or disability. If there is any problem keeping the appointment, you must call to reschedule.  SEEK IMMEDIATE MEDICAL CARE IF:   Your pain is not gone in 24 hours.   Your pain becomes worse, changes location, or feels different.   You or your child has an oral temperature above 102 F (38.9 C), not controlled by medicine.   Your baby is older than 3 months with a rectal temperature of 102 F (38.9 C) or higher.   Your baby is 38 months old or younger with a rectal temperature of 100.4 F (38 C) or higher.   You have shaking chills.   You keep throwing up (vomiting) or cannot drink liquids.   There is blood in your vomit or you see blood in your bowel movements.   Your bowel movements become dark or black.   You have frequent bowel movements.   Your bowel movements stop (become blocked) or you cannot pass gas.   You have bloody, frequent, or painful urination.   You have yellow discoloration in the skin or whites of the eyes.   Your stomach  becomes bloated or bigger.   You have dizziness or fainting.   You have chest or back pain.  MAKE SURE YOU:   Understand these instructions.   Will watch your condition.   Will get help right away if you are not doing well or get worse.  Document Released: 07/04/2005 Document Revised: 06/23/2011 Document Reviewed: 06/01/2009 [facility] Patient Information 2012 [place], [place].

## 2011-09-29 ENCOUNTER — Telehealth: Payer: Self-pay | Admitting: Family Medicine

## 2011-09-29 ENCOUNTER — Telehealth (HOSPITAL_COMMUNITY): Payer: Self-pay | Admitting: *Deleted

## 2011-09-29 MED ORDER — POTASSIUM CHLORIDE ER 10 MEQ PO CPCR: 20.0000 meq | ORAL_CAPSULE | Freq: Two times a day (BID) | ORAL | Status: DC

## 2011-09-29 MED ORDER — PROMETHAZINE HCL 12.5 MG PO TABS: 12.5000 mg | ORAL_TABLET | Freq: Three times a day (TID) | ORAL | Status: AC | PRN

## 2011-09-29 NOTE — ED Notes (Signed)
Pt. instructed to get potassium rechecked with her PCP after she finishes her medication. Pt. does not have a PCP. Pt. told she can come back here or another Urgent care to get it checked. Pt. voiced understanding. Vassie Moselle 09/29/2011

## 2011-09-29 NOTE — Telephone Encounter (Signed)
Called pt this morning.   She continues to have back pain and mild nausea. She was unable to fill the Zofran as it was over $100.  She is not feeling very much better but is not worse. She is eating and drinking.    Plan to prescribe Phenergan as it will be cheaper.  Additionally discussed warning signs such as worsening abdominal pain or uncontrollable vomiting. I advised presentation to the emergency room this afternoon or tomorrow if her pain worsens or is not resolving.  She expresses understanding and can explain when she should go to the emergency room.

## 2011-09-29 NOTE — ED Notes (Signed)
GC/Chlamydia neg., Wet prep: Mod. clue cells, few WBC's. Pt. denied vaginal discharge but had some on exam. Chart shown to Dr. Lorenz Coaster and he said Dr. Clayburn Pert did not think pt. needed treatment, so no further needed for that, but pt. does need Rx. for Micro K called in for low potassium. See telephone note.  Kristen Zimmerman 09/29/2011

## 2011-09-29 NOTE — ED Provider Notes (Addendum)
Medical screening examination/treatment/procedure(s) were performed by a resident physician and as supervising physician I was immediately available for consultation/collaboration.  Leslee Home, M.D.   Reuben Likes, MD 09/29/11 [phone]    Labs came back today showing a K of 3.2 and moderate clue cells.  It doesn't sound like she was symptomatic from the clue cells.  She would benefit from K replacement.  Will send in a Rx for K-Dur 20 mEq BID for 7 days.   Reuben Likes, MD 09/29/11 1537

## 2011-09-29 NOTE — ED Notes (Signed)
I called pt. and verified x 2. Pt. given results and told she needs potassium 2 caps twice a day for 7 days. At first, pt. wanted Rx. called to [facility]- states she is sleepy from the nausea medicine. She changed her mind and wants Rx. called to [facility] on [address]. Pt. states she had been vomiting a lot until she got the nausea medicine. I explained that is why her potassium was low. Pt. instructed to drink plenty of water to flush the antibiotic through her urinary tract and prevent dehydration. Pt. instructed to go to ED if she can't keep her medicine down or pain getting worse. Pt. voiced understanding. Rx. called to voicemail @ [phone]. Vassie Moselle 09/29/2011

## 2012-01-21 ENCOUNTER — Emergency Department (INDEPENDENT_AMBULATORY_CARE_PROVIDER_SITE_OTHER)
Admission: EM | Admit: 2012-01-21 | Discharge: 2012-01-21 | Disposition: A | Discharge status: Home / Self Care | Payer: Self-pay | Source: Home / Self Care | Attending: Emergency Medicine | Admitting: Emergency Medicine

## 2012-01-21 ENCOUNTER — Encounter (HOSPITAL_COMMUNITY): Payer: Self-pay

## 2012-01-21 DIAGNOSIS — N61 Mastitis without abscess: Secondary | ICD-10-CM

## 2012-01-21 DIAGNOSIS — N6459 Other signs and symptoms in breast: Secondary | ICD-10-CM

## 2012-01-21 HISTORY — DX: Unspecified asthma, uncomplicated: J45.909

## 2012-01-21 LAB — POCT URINALYSIS DIP (DEVICE)
Glucose, UA: NEGATIVE mg/dL
Hgb urine dipstick: NEGATIVE
Nitrite: NEGATIVE
Protein, ur: 30 mg/dL — AB
Urobilinogen, UA: 2 mg/dL — ABNORMAL HIGH (ref 0.0–1.0)

## 2012-01-21 MED ORDER — FLUCONAZOLE 150 MG PO TABS: ORAL_TABLET | ORAL | Status: AC

## 2012-01-21 MED ORDER — NYSTATIN-TRIAMCINOLONE 100000-0.1 UNIT/GM-% EX OINT: TOPICAL_OINTMENT | Freq: Two times a day (BID) | CUTANEOUS | Status: AC

## 2012-01-21 MED ORDER — IBUPROFEN 800 MG PO TABS: 800.0000 mg | ORAL_TABLET | Freq: Three times a day (TID) | ORAL | Status: DC | PRN

## 2012-01-21 MED ORDER — CEPHALEXIN 500 MG PO CAPS: 500.0000 mg | ORAL_CAPSULE | Freq: Four times a day (QID) | ORAL | Status: AC

## 2012-01-21 NOTE — ED Provider Notes (Signed)
History     CSN: [id number]  Arrival date & time 01/21/12  1634   First MD Initiated Contact with Patient 01/21/12 1740      Chief Complaint  Patient presents with  . Urinary Tract Infection    (Consider location/radiation/quality/duration/timing/severity/associated sxs/prior treatment) HPI Comments: Patient presents with 2 complaints: First, she reports intermittent erythema, swelling, tenderness over her right breast in the 1:00 position above her nipple for the past 2 months. She has an inverted nipple, notes some whitish material around the nipple. States that she has not been able to express any purulent material from her nipple when it is red and swollen. It is worse with palpation, and better with ibuprofen. No trauma to the area. No nausea, vomiting, fevers. No other breast pain. She has never been pregnant. She is not on any neuroleptics. She has not tried anything else for her symptoms. She has no history of breast cancer.  Second time patient reports intermittent, seconds long low back and flank pain for the past several days. It has no particular aggravating or alleviating factors - is not affected with eating, bowel movement, fasting, movement. No nausea, vomiting, abdominal distention. No urinary urgency, frequency, dysuria, cloudy or oderous urine, hematuria, vaginal bleeding, vaginal discharge. She is concerned that this could be a UTI.  ROS as noted in HPI. All other ROS negative.   The history is provided by the patient. No language interpreter was used.    Past Medical History  Diagnosis Date  . Asthma     childhood now resolved    History reviewed. No pertinent past surgical history.  History reviewed. No pertinent family history.  History  Substance Use Topics  . Smoking status: Current Everyday Smoker  . Smokeless tobacco: Not on file  . Alcohol Use: Yes    OB History    Grav Para Term Preterm Abortions TAB SAB Ect Mult Living                   Review of Systems  Allergies  Review of patient's allergies indicates no known allergies.  Home Medications   Current Outpatient Rx  Name Route Sig Dispense Refill  . CEPHALEXIN 500 MG PO CAPS Oral Take 1 capsule (500 mg total) by mouth 4 (four) times daily. X 10 days 40 capsule 0  . FLUCONAZOLE 150 MG PO TABS  1 tab po x 1. May repeat in 72 hours if no improvement 2 tablet 0  . IBUPROFEN 800 MG PO TABS Oral Take 1 tablet (800 mg total) by mouth every 8 (eight) hours as needed for pain. 30 tablet 0  . NYSTATIN-TRIAMCINOLONE 100000-0.1 UNIT/GM-% EX OINT Topical Apply topically 2 (two) times daily. 30 g 0    BP 122/84  Pulse 85  Temp 98.7 F (37.1 C) (Oral)  Resp 17  SpO2 96%  LMP 01/03/2012  Physical Exam  Nursing note and vitals reviewed. Constitutional: She is oriented to person, place, and time. She appears well-developed and well-nourished.  HENT:  Head: Normocephalic and atraumatic.  Eyes: Conjunctivae and EOM are normal.  Neck: Normal range of motion.  Cardiovascular: Normal rate, regular rhythm, normal heart sounds and intact distal pulses.   No murmur heard. Pulmonary/Chest: Effort normal and breath sounds normal. Right breast exhibits inverted nipple, nipple discharge and tenderness. Right breast exhibits no mass and no skin change. Breasts are symmetrical.         Approximately 2 x 3 cm area of tenderness, erythema at nipple see  drawing. Thick whitish material at nipple. Cloudy, purulent expressible material from nipple. No other masses  Abdominal: Soft. Normal appearance and bowel sounds are normal. She exhibits no distension. There is no tenderness. There is no rebound, no guarding and no CVA tenderness.  Musculoskeletal: Normal range of motion. She exhibits no edema and no tenderness.  Neurological: She is alert and oriented to person, place, and time.  Skin: Skin is warm and dry.  Psychiatric: She has a normal mood and affect. Her behavior is normal.  Judgment and thought content normal.    ED Course  Procedures (including critical care time)  Labs Reviewed  POCT URINALYSIS DIP (DEVICE) - Abnormal; Notable for the following:    Bilirubin Urine SMALL (*)     Ketones, ur TRACE (*)     pH 8.5 (*)     Protein, ur 30 (*)     Urobilinogen, UA 2.0 (*)     All other components within normal limits  POCT PREGNANCY, URINE  FUNGUS CULTURE W SMEAR   No results found.   1. Inverted nipple   2. Infection of the breast and nipple     Results for orders placed during the hospital encounter of 01/21/12  POCT URINALYSIS DIP (DEVICE)      Component Value Range   Glucose, UA NEGATIVE  NEGATIVE mg/dL   Bilirubin Urine SMALL (*) NEGATIVE   Ketones, ur TRACE (*) NEGATIVE mg/dL   Specific Gravity, Urine 1.015  1.005 - 1.030   Hgb urine dipstick NEGATIVE  NEGATIVE   pH 8.5 (*) 5.0 - 8.0   Protein, ur 30 (*) NEGATIVE mg/dL   Urobilinogen, UA 2.0 (*) 0.0 - 1.0 mg/dL   Nitrite NEGATIVE  NEGATIVE   Leukocytes, UA NEGATIVE  NEGATIVE  POCT PREGNANCY, URINE      Component Value Range   Preg Test, Ur NEGATIVE  NEGATIVE      MDM  Previous labs reviewed. Additional medical history obtained. History of BV. Tested negative for Trichomonas, gonorrhea and Chlamydia 4 times since 2008.  Patient appears to have a yeast infection of her nipple with secondary bacterial infection. Will treat her with Diflucan, and Keflex. Sending her home with topical antifungal cream. Will refer her to the breast center for ultrasound as this is been coming and going for 2 months. UA is negative for UTI. Abdomen benign, unremarkable. Advised patient to increase fluids. Discussed signs and symptoms that should prompt a return to the department. Patient agrees with plan.  Luiz Blare, MD 01/21/12 [phone]

## 2012-01-21 NOTE — ED Notes (Signed)
SPECIMEN SENT FOR FUNGAL CULTURE

## 2012-01-21 NOTE — ED Notes (Signed)
Pt has low back pain and pain that radiates to abd on both sides of abd.  She also has lesion on rt breast that she describes as "coming and going and is not painful"

## 2012-01-22 NOTE — ED Notes (Signed)
Pt called and states the nystatin is expensive and called in nystin 10,000 u/Gm apply bid 30Gm 0 refill and kenlog 0.1% apply bid for 2 days 30 Gm with no refills per Dr Chaney Malling to [facility] on [address]. [facility]

## 2012-02-07 ENCOUNTER — Other Ambulatory Visit: Payer: Self-pay | Admitting: Emergency Medicine

## 2012-02-07 DIAGNOSIS — N6452 Nipple discharge: Secondary | ICD-10-CM

## 2012-02-07 DIAGNOSIS — N644 Mastodynia: Secondary | ICD-10-CM

## 2012-02-07 DIAGNOSIS — N6459 Other signs and symptoms in breast: Secondary | ICD-10-CM

## 2012-02-17 ENCOUNTER — Other Ambulatory Visit: Payer: Self-pay

## 2012-02-20 LAB — FUNGUS CULTURE W SMEAR

## 2012-02-20 NOTE — ED Notes (Signed)
Fungal culture with smear: No yeast or fungal elements seen, fungal culture: no fungi isolated in 4 weeks. Kristen Zimmerman 02/20/2012

## 2012-03-05 ENCOUNTER — Other Ambulatory Visit: Payer: Self-pay | Admitting: Emergency Medicine

## 2012-03-05 ENCOUNTER — Ambulatory Visit
Admission: RE | Admit: 2012-03-05 | Discharge: 2012-03-05 | Disposition: A | Discharge status: Home / Self Care | Payer: No Typology Code available for payment source | Source: Ambulatory Visit | Attending: Emergency Medicine | Admitting: Emergency Medicine

## 2012-03-05 DIAGNOSIS — N644 Mastodynia: Secondary | ICD-10-CM

## 2012-03-05 DIAGNOSIS — N6452 Nipple discharge: Secondary | ICD-10-CM

## 2012-03-05 DIAGNOSIS — N6459 Other signs and symptoms in breast: Secondary | ICD-10-CM

## 2012-03-20 ENCOUNTER — Other Ambulatory Visit: Payer: Self-pay | Admitting: Emergency Medicine

## 2012-03-20 ENCOUNTER — Ambulatory Visit
Admission: RE | Admit: 2012-03-20 | Discharge: 2012-03-20 | Disposition: A | Discharge status: Home / Self Care | Payer: No Typology Code available for payment source | Source: Ambulatory Visit | Attending: Emergency Medicine | Admitting: Emergency Medicine

## 2012-03-20 DIAGNOSIS — N6459 Other signs and symptoms in breast: Secondary | ICD-10-CM

## 2012-03-20 DIAGNOSIS — N644 Mastodynia: Secondary | ICD-10-CM

## 2012-03-20 DIAGNOSIS — N6452 Nipple discharge: Secondary | ICD-10-CM

## 2012-03-24 LAB — CULTURE, ROUTINE-ABSCESS: Culture: NO GROWTH

## 2014-07-24 ENCOUNTER — Encounter (HOSPITAL_COMMUNITY): Payer: Self-pay | Admitting: Cardiology

## 2014-07-24 ENCOUNTER — Emergency Department (HOSPITAL_COMMUNITY)
Admission: EM | Admit: 2014-07-24 | Discharge: 2014-07-24 | Disposition: A | Discharge status: Home / Self Care | Payer: Self-pay | Attending: Emergency Medicine | Admitting: Emergency Medicine

## 2014-07-24 DIAGNOSIS — Y9289 Other specified places as the place of occurrence of the external cause: Secondary | ICD-10-CM | POA: Insufficient documentation

## 2014-07-24 DIAGNOSIS — S3992XA Unspecified injury of lower back, initial encounter: Secondary | ICD-10-CM | POA: Insufficient documentation

## 2014-07-24 DIAGNOSIS — J45909 Unspecified asthma, uncomplicated: Secondary | ICD-10-CM | POA: Insufficient documentation

## 2014-07-24 DIAGNOSIS — X58XXXA Exposure to other specified factors, initial encounter: Secondary | ICD-10-CM | POA: Insufficient documentation

## 2014-07-24 DIAGNOSIS — Y990 Civilian activity done for income or pay: Secondary | ICD-10-CM | POA: Insufficient documentation

## 2014-07-24 DIAGNOSIS — M545 Low back pain: Secondary | ICD-10-CM

## 2014-07-24 DIAGNOSIS — Z720 Tobacco use: Secondary | ICD-10-CM | POA: Insufficient documentation

## 2014-07-24 DIAGNOSIS — Y9389 Activity, other specified: Secondary | ICD-10-CM | POA: Insufficient documentation

## 2014-07-24 MED ORDER — METHOCARBAMOL 500 MG PO TABS: 500.0000 mg | ORAL_TABLET | Freq: Two times a day (BID) | ORAL | Status: DC

## 2014-07-24 NOTE — ED Notes (Signed)
Patient reports she took 800mg  motrin for pain that doesn't work.

## 2014-07-24 NOTE — ED Notes (Signed)
Pt reports that she was at work yesterday and lifted a box and felt upper back pain. States increased pain this morning.

## 2014-07-24 NOTE — ED Provider Notes (Signed)
CSN: [id number]     Arrival date & time 07/24/14  1757 History   First MD Initiated Contact with Patient 07/24/14 1937     Chief Complaint  Patient presents with  . Back Pain     (Consider location/radiation/quality/duration/timing/severity/associated sxs/prior Treatment) HPI   39 year old female with history of asthma who presents complaining of back pain. Patient states she has intermittent back pain ongoing for the past several months. Patient states she works at [employer]. She normally does not perform any heavy lifting but 2 days ago she reached down to lift a box and when she stood up she felt a sharp pain to her left and right lower back. No popping sensation and she continues to work throughout the shift but the next day she reported having severe muscle tightness and pain to the lower back worsen with movement with stiffness to her neck. She decided to take a day off due to the pain and come to ER today for further evaluation of her back pain. She denies having any sensation of lightheadedness dizziness, chest pain, shortness of breath on numbness, weakness, or rash.  Past Medical History  Diagnosis Date  . Asthma     childhood now resolved   History reviewed. No pertinent past surgical history. History reviewed. No pertinent family history. History  Substance Use Topics  . Smoking status: Current Every Day Smoker    Types: Cigarettes  . Smokeless tobacco: Not on file  . Alcohol Use: Yes   OB History    No data available     Review of Systems  Constitutional: Negative for fever.  Gastrointestinal: Negative for abdominal pain.  Musculoskeletal: Positive for back pain and arthralgias.  Skin: Negative for rash and wound.  Neurological: Negative for numbness and headaches.      Allergies  Review of patient's allergies indicates no known allergies.  Home Medications   Prior to Admission medications   Medication Sig Start Date End Date Taking? Authorizing Provider   ibuprofen (ADVIL,MOTRIN) 800 MG tablet Take 1 tablet (800 mg total) by mouth every 8 (eight) hours as needed for pain. 01/21/12   Domenick Gong, MD   BP 108/74 mmHg  Pulse 92  Temp(Src) 98.7 F (37.1 C) (Oral)  Resp 17  Ht  (1.676 m)  Wt 180 lb (81.647 kg)  BMI 29.07 kg/m2  SpO2 98%  LMP 07/23/2014 Physical Exam  Constitutional: She appears well-developed and well-nourished. No distress.  HENT:  Head: Atraumatic.  Eyes: Conjunctivae are normal.  Neck: Normal range of motion. Neck supple.  No nuchal rigidity  Abdominal: Soft. There is no tenderness.  Musculoskeletal: She exhibits no tenderness (no significant midline spine tenderness, crepitus, or step off.  FROM).  5/5 strength to all 4 extremities.  Normal gait.   Neurological: She is alert.  Skin: No rash noted.  Psychiatric: She has a normal mood and affect.  Nursing note and vitals reviewed.   ED Course  Procedures (including critical care time)  8:13 PM sxs suggested of a muscle strain.  No red flags.  Able to ambulate, doubt infectious etiology or meningitis.  Plan to treat sxs.  Recommend f/u with PCP for further care.  Return precaution discussed.  Doubt kidney stone, spinal fracture, or shingle causing pain.  Does not think xray or advance imaging is indicative given the duration of pain and lack of trauma.    Labs Review Labs Reviewed - No data to display  Imaging Review No results found.   EKG  Interpretation None      MDM   Final diagnoses:  Low back pain without sciatica, unspecified back pain laterality    BP 108/74 mmHg  Pulse 92  Temp(Src) 98.7 F (37.1 C) (Oral)  Resp 17  Ht 5\' 6"  (1.676 m)  Wt 180 lb (81.647 kg)  BMI 29.07 kg/m2  SpO2 98%  LMP 07/23/2014     Fayrene HelperBowie Jeany Seville, PA-C 07/24/14 2025  Tilden FossaElizabeth Rees, MD 07/25/14 [phone]

## 2014-07-24 NOTE — Discharge Instructions (Signed)
Back Exercises These exercises may help you when beginning to rehabilitate your injury. Your symptoms may resolve with or without further involvement from your physician, physical therapist or athletic trainer. While completing these exercises, remember:   Restoring tissue flexibility helps normal motion to return to the joints. This allows healthier, less painful movement and activity.  An effective stretch should be held for at least 30 seconds.  A stretch should never be painful. You should only feel a gentle lengthening or release in the stretched tissue. STRETCH - Extension, Prone on Elbows   Lie on your stomach on the floor, a bed will be too soft. Place your palms about shoulder width apart and at the height of your head.  Place your elbows under your shoulders. If this is too painful, stack pillows under your chest.  Allow your body to relax so that your hips drop lower and make contact more completely with the floor.  Hold this position for __________ seconds.  Slowly return to lying flat on the floor. Repeat __________ times. Complete this exercise __________ times per day.  RANGE OF MOTION - Extension, Prone Press Ups   Lie on your stomach on the floor, a bed will be too soft. Place your palms about shoulder width apart and at the height of your head.  Keeping your back as relaxed as possible, slowly straighten your elbows while keeping your hips on the floor. You may adjust the placement of your hands to maximize your comfort. As you gain motion, your hands will come more underneath your shoulders.  Hold this position __________ seconds.  Slowly return to lying flat on the floor. Repeat __________ times. Complete this exercise __________ times per day.  RANGE OF MOTION- Quadruped, Neutral Spine   Assume a hands and knees position on a firm surface. Keep your hands under your shoulders and your knees under your hips. You may place padding under your knees for  comfort.  Drop your head and point your tail bone toward the ground below you. This will round out your low back like an angry cat. Hold this position for __________ seconds.  Slowly lift your head and release your tail bone so that your back sags into a large arch, like an old horse.  Hold this position for __________ seconds.  Repeat this until you feel limber in your low back.  Now, find your "sweet spot." This will be the most comfortable position somewhere between the two previous positions. This is your neutral spine. Once you have found this position, tense your stomach muscles to support your low back.  Hold this position for __________ seconds. Repeat __________ times. Complete this exercise __________ times per day.  STRETCH - Flexion, Single Knee to Chest   Lie on a firm bed or floor with both legs extended in front of you.  Keeping one leg in contact with the floor, bring your opposite knee to your chest. Hold your leg in place by either grabbing behind your thigh or at your knee.  Pull until you feel a gentle stretch in your low back. Hold __________ seconds.  Slowly release your grasp and repeat the exercise with the opposite side. Repeat __________ times. Complete this exercise __________ times per day.  STRETCH - Hamstrings, Standing  Stand or sit and extend your right / left leg, placing your foot on a chair or foot stool  Keeping a slight arch in your low back and your hips straight forward.  Lead with your chest and  lean forward at the waist until you feel a gentle stretch in the back of your right / left knee or thigh. (When done correctly, this exercise requires leaning only a small distance.)  Hold this position for __________ seconds. Repeat __________ times. Complete this stretch __________ times per day. STRENGTHENING - Deep Abdominals, Pelvic Tilt   Lie on a firm bed or floor. Keeping your legs in front of you, bend your knees so they are both pointed  toward the ceiling and your feet are flat on the floor.  Tense your lower abdominal muscles to press your low back into the floor. This motion will rotate your pelvis so that your tail bone is scooping upwards rather than pointing at your feet or into the floor.  With a gentle tension and even breathing, hold this position for __________ seconds. Repeat __________ times. Complete this exercise __________ times per day.  STRENGTHENING - Abdominals, Crunches   Lie on a firm bed or floor. Keeping your legs in front of you, bend your knees so they are both pointed toward the ceiling and your feet are flat on the floor. Cross your arms over your chest.  Slightly tip your chin down without bending your neck.  Tense your abdominals and slowly lift your trunk high enough to just clear your shoulder blades. Lifting higher can put excessive stress on the low back and does not further strengthen your abdominal muscles.  Control your return to the starting position. Repeat __________ times. Complete this exercise __________ times per day.  STRENGTHENING - Quadruped, Opposite UE/LE Lift   Assume a hands and knees position on a firm surface. Keep your hands under your shoulders and your knees under your hips. You may place padding under your knees for comfort.  Find your neutral spine and gently tense your abdominal muscles so that you can maintain this position. Your shoulders and hips should form a rectangle that is parallel with the floor and is not twisted.  Keeping your trunk steady, lift your right hand no higher than your shoulder and then your left leg no higher than your hip. Make sure you are not holding your breath. Hold this position __________ seconds.  Continuing to keep your abdominal muscles tense and your back steady, slowly return to your starting position. Repeat with the opposite arm and leg. Repeat __________ times. Complete this exercise __________ times per day. Document Released:  07/22/2005 Document Revised: 09/26/2011 Document Reviewed: 10/16/2008 [facility] Patient Information 2015 [place], [place]. This information is not intended to replace advice given to you by your health care provider. Make sure you discuss any questions you have with your health care provider.   Emergency Department Resource Guide 1) Find a Doctor and Pay Out of Pocket Although you won't have to find out who is covered by your insurance plan, it is a good idea to ask around and get recommendations. You will then need to call the office and see if the doctor you have chosen will accept you as a new patient and what types of options they offer for patients who are self-pay. Some doctors offer discounts or will set up payment plans for their patients who do not have insurance, but you will need to ask so you aren't surprised when you get to your appointment.  2) Contact Your Local Health Department Not all health departments have doctors that can see patients for sick visits, but many do, so it is worth a call to see if yours does. If  you don't know where your local health department is, you can check in your phone book. The CDC also has a tool to help you locate your state's health department, and many state websites also have listings of all of their local health departments.  3) Find a Walk-in Clinic If your illness is not likely to be very severe or complicated, you may want to try a walk in clinic. These are popping up all over the country in pharmacies, drugstores, and shopping centers. They're usually staffed by nurse practitioners or physician assistants that have been trained to treat common illnesses and complaints. They're usually fairly quick and inexpensive. However, if you have serious medical issues or chronic medical problems, these are probably not your best option.  No Primary Care Doctor: - Call Health Connect at  [phone] - they can help you locate a primary care doctor that  accepts  your insurance, provides certain services, etc. - Physician Referral Service- [phone]  Chronic Pain Problems: Organization         Address  Phone   Notes  [facility]  [phone] Patients need to be referred by their primary care doctor.   Medication Assistance: Organization         Address  Phone   Notes  [facility] Medication [facility] [address] [place], [place] [address] [phone] --Must be a resident of [facility] -- Must have NO insurance coverage whatsoever (no Medicaid/ Medicare, etc.) -- The pt. MUST have a primary care doctor that directs their care regularly and follows them in the community   [facility]ist  [phone]   [employer]  [phone]    Agencies that provide inexpensive medical care: Organization         Address  Phone   Notes  [facility]  [phone]   [facility]    [phone]   [facility] [address] [place], [place] [address] [phone]   [facility] [address] [place], [place] [phone]   Planned Parenthood    [phone]   [facility]    [phone]   [facility]  [address], [place] Phone:  [phone], Fax:  [phone] Hours of Operation:  9 am - 6 pm, M-F.  Also accepts Medicaid/Medicare and self-pay.  [facility]  [address], [place] Phone: [phone], Fax: [phone]. Hours of Operation:  8:30 am - 5:30 pm, M-F.  Also accepts Medicaid and self-pay.  [facility] [place] [address], [place] Phone: [phone]   [facility] [address] Natasha Bence [place], [place] [phone], Ext. [phone] Mondays & Thursdays: 7-9 AM.  First 15 patients are seen on a first come, first serve basis.    Medicaid-accepting [facility] Providers:  Organization          Address  Phone   Notes  [facility] [address], [place] [phone] Also accepts self-pay patients.  [facility] [address] Laurell Josephs [place], [place]  [phone]   [facility] [address], [place] [phone]   [facility] [address], [place] [phone]   Renaye Rakers [address], [place]   [phone] Only accepts  [place] Access Medicaid patients after they have their name applied to their card.   Self-Pay (no insurance) in [place]:  Organization         Address  Phone   Notes  Sickle Cell Patients, [facility] [address], [place] [phone]) [phone]   [facility] [address], [place] [phone]) [phone]   [facility] [place]  [address], [place] [phone]   [facility]/Dr. Osei-Bonsu  [address] [place] [address], [place] or [phone], [place] [phone]) [phone] Phone number for both [place] and [place] locations is the same.  [facility] [address], [place] [phone]) [phone]   [facility] [place] [address] [place] [address], [place] or [address] [phone]) [phone]) [phone]   [facility] [address], [place] [phone](336) [phone], phone; [phone]) [phone], fax Sees patients 1st and 3rd Saturday of every month.  Must not qualify for public or private insurance (i.e. Medicaid, Medicare, [place] Health Choice, Veterans' Benefits)  Household income should be no more than 200% of the poverty level The clinic cannot treat you if you are pregnant or think you are pregnant  Sexually transmitted diseases are not treated at the clinic.    Dental Care: Organization         Address  Phone  Notes  [place] [facility] [address],  [place] [phone]) [phone] Accepts children up to age 37 who are enrolled in [place] or [place] Health Choice; pregnant women with a Medicaid card; and children who have applied for Medicaid or [place] Health Choice, but were declined, whose parents can pay a reduced fee at time of service.  [facility] [place]  [address], [place] [phone] Accepts children up to age 37 who are enrolled in [place] or [place] Health Choice; pregnant women with a Medicaid card; and children who have applied for Medicaid or [place] Health Choice, but were declined, whose parents can pay a reduced fee at time of service.  [facility]  [address], [place] [phone]) [phone] Patients are seen by appointment only. Walk-ins are not accepted. [facility] will see patients 37 years of age and older. Monday - Tuesday (8am-5pm) Most Wednesdays (8:30-5pm) $30 per visit, cash only  [facility]  [address], [facility] [phone]) [phone] Patients are seen by appointment only. Walk-ins are not accepted. [facility] will see patients 37 years of age and older. One Wednesday Evening (Monthly: Volunteer Based).  $30 per visit, cash only  [facility]  [phone] for adults; Children under age 434, call Graduate Pediatric Dentistry at [phone]) [phone]. Children aged 934-14, please call [phone] to request a pediatric application.  Dental services are provided in all areas of dental care including fillings, crowns and bridges, complete and partial dentures, implants, gum treatment, root canals, and extractions. Preventive care is also provided. Treatment is provided to both adults and children. Patients are selected via a lottery and there is often a waiting list.   [facility] [address], [place]  [phone]) [phone] [email or web address]   [facility] [address], [place], [place]  [phone], Ext. [phone] Second and Fourth Thursday of each month, opens at 6:30 AM; Clinic ends at  9 AM.  Patients are seen on a first-come first-served basis, and a limited number are seen during each clinic.   [facility]  [address] Ether Griffins [place], [place] [phone]   Eligibility Requirements You must have lived in [place], [place], or [place] counties for at least the last three months.   You cannot be eligible for state or federal sponsored [place], including [employer], [place], or [employer].   You generally cannot be eligible for healthcare insurance through your employer.    How to apply: Eligibility screenings are held every Tuesday and Wednesday afternoon from 1:00 pm until 4:00 pm. You do not need an appointment for the interview!  [facility] [address] [place], [place] [phone]   [facility] Health Department  [phone]   [facility] Health Department  [phone]   [facility] Health Department  [phone]    Behavioral Health Resources in the Community: Intensive Outpatient Programs Organization         Address  Phone  Notes  [facility] [address] [place], [place] [phone]   [facility] Outpatient [address] [place], [place] [phone]   [facility] [address] [place], [place]  [phone]   [facility] Mental Health [address],  [place], [place] [phone] or [phone]   Substance Abuse Resources Organization         Address  Phone  Notes  [facility]  [phone]   [facility]  [phone]   [facility]  [phone]   Floydene Flock  [phone]   Residential & Outpatient Substance Abuse Program  5196521822   Psychological Services Organization         Address  Phone  Notes  [facility] Behavioral Health  [phone]   [facility] Services  [phone]    [facility] Mental Health [address] [place] [phone] or [phone]    Mobile Crisis Teams Organization         Address  Phone  Notes  [facility], Mobile Crisis Care Unit  [phone]   [facility]  [address] [place], [place] [phone]   Doristine Locks [address] [place] [phone]    Self-Help/Support Groups Organization         Address  Phone             Notes  [facility] [place] - variety of support groups  [phone]- I7437963 Call for more information  Narcotics Anonymous (NA), [facility] [address], [employer] [place]  2 meetings at this location   Statistician         Address  Phone  Notes  [facility] [address],    [place]  [phone]   [facility]  [address] [place] [address], [place], [place] [phone]   [facility] [address] [place], [place] [phone] Admissions: 8am-3pm M-F  [facility] [address],    [place], [place] [phone]   [facility] [address] Starling Manns [place], [place] [phone]   [facility] [address]  [place], [place] [phone]   [facility] - Intensive Outpatient [address] Laurell Josephs [address], [place], [place] [phone]   [facility] ([facility] [address] [place]  [place], [place] [phone] or [phone]   [facility] (  RTS) [address], [place], [place] [phone] Accepts Medicaid  [facility] [place] [address],  [place] [phone] Substance Abuse/Addiction Treatment   [facility] Organization         Address  Phone  Notes  [facility]  [phone]   Angie Fava, PhD [address] Ervin Knack [place], [place]   [phone] or [phone]   [facility] Behavioral   [address] [place], [place] [phone]   [facility] [address] [place], [place] [phone] Insurance/Medicaid/sponsorship through [facility] [address]                                    [place], [place] [phone] Therapy/tele-psych/case  [facility] [address], [place] [phone]    Dr. Lolly Mustache  [phone]   [facility] [place]  [employer] [facility] Dept. 1) [address] [place] 2) [address] [place] 3)  [address], [place] [phone]  [phone]   [facility] Child Abuse Hotline [phone] [phone] or [phone] (After Hours)

## 2014-11-04 ENCOUNTER — Encounter (HOSPITAL_COMMUNITY): Payer: Self-pay | Admitting: Emergency Medicine

## 2014-11-04 ENCOUNTER — Emergency Department (HOSPITAL_COMMUNITY)
Admission: EM | Admit: 2014-11-04 | Discharge: 2014-11-04 | Disposition: A | Discharge status: Home / Self Care | Payer: 59 | Source: Home / Self Care | Attending: Family Medicine | Admitting: Family Medicine

## 2014-11-04 DIAGNOSIS — N938 Other specified abnormal uterine and vaginal bleeding: Secondary | ICD-10-CM | POA: Diagnosis not present

## 2014-11-04 LAB — POCT PREGNANCY, URINE: PREG TEST UR: NEGATIVE

## 2014-11-04 MED ORDER — NORETHINDRONE-ETH ESTRADIOL 1-35 MG-MCG PO TABS: ORAL_TABLET | ORAL | Status: DC

## 2014-11-04 NOTE — ED Provider Notes (Signed)
CSN: [id number]     Arrival date & time 11/04/14  1321 History   First MD Initiated Contact with Patient 11/04/14 1524     Chief Complaint  Patient presents with  . Vaginal Bleeding   (Consider location/radiation/quality/duration/timing/severity/associated sxs/prior Treatment) Patient is a 39 y.o. female presenting with vaginal bleeding. The history is provided by the patient.  Vaginal Bleeding Quality:  Bright red, heavier than menses and clots Severity:  Moderate Onset quality:  Sudden Duration:  1 day Progression:  Worsening Chronicity:  New Menstrual history:  Regular Number of pads used:  6 Number of tampons used:  9 Possible pregnancy: no   Context: spontaneously   Relieved by:  None tried Worsened by:  Nothing tried Ineffective treatments:  None tried Associated symptoms: back pain   Associated symptoms: no abdominal pain, no fever and no vaginal discharge   Risk factors comment:  Nulliparous   Past Medical History  Diagnosis Date  . Asthma     childhood now resolved   History reviewed. No pertinent past surgical history. History reviewed. No pertinent family history. History  Substance Use Topics  . Smoking status: Current Every Day Smoker    Types: Cigarettes  . Smokeless tobacco: Not on file  . Alcohol Use: Yes   OB History    No data available     Review of Systems  Constitutional: Negative.  Negative for fever.  Gastrointestinal: Negative.  Negative for abdominal pain.  Genitourinary: Positive for vaginal bleeding. Negative for vaginal discharge.  Musculoskeletal: Positive for back pain.    Allergies  Review of patient's allergies indicates no known allergies.  Home Medications   Prior to Admission medications   Medication Sig Start Date End Date Taking? Authorizing Provider  ibuprofen (ADVIL,MOTRIN) 800 MG tablet Take 1 tablet (800 mg total) by mouth every 8 (eight) hours as needed for pain. 01/21/12   Domenick GongAshley Mortenson, MD  methocarbamol  (ROBAXIN) 500 MG tablet Take 1 tablet (500 mg total) by mouth 2 (two) times daily. 07/24/14   Fayrene HelperBowie Tran, PA-C  norethindrone-ethinyl estradiol 1/35 (ORTHO-NOVUM, NORTREL,CYCLAFEM) tablet 1 pill bid for 1 week then 1 daily until finished pack 11/04/14   Linna HoffJames D Brenae Lasecki, MD   BP 142/94 mmHg  Pulse 85  Temp(Src) 98.2 F (36.8 C) (Oral)  Resp 18  SpO2 100%  LMP 11/03/2014 (Exact Date) Physical Exam  Constitutional: She is oriented to person, place, and time. She appears well-developed and well-nourished.  Abdominal: Soft. Bowel sounds are normal.  Genitourinary: Uterus normal. Uterus is not tender. Cervix exhibits no motion tenderness and no friability. There is bleeding in the vagina. No tenderness in the vagina. No vaginal discharge found.  Neurological: She is alert and oriented to person, place, and time.  Skin: Skin is warm and dry.  Nursing note and vitals reviewed.   ED Course  Procedures (including critical care time) Labs Review Labs Reviewed  POCT PREGNANCY, URINE   upreg--neg. Imaging Review No results found.   MDM   1. DUB (dysfunctional uterine bleeding)        Linna HoffJames D Dalissa Lovin, MD 11/04/14 [phone]

## 2014-11-04 NOTE — ED Notes (Signed)
Pt reports a very heavy flow associated with her period, starting yesterday.  She states she has had a lot of clots and there are times the blood is running down her leg.  She also reports pain and pressure in her lower back.

## 2014-11-04 NOTE — Discharge Instructions (Signed)
Take pills as instucted, see gynecologist in 2 weeks for recheck, unless bleeding worsens [facility].

## 2015-07-17 ENCOUNTER — Encounter (HOSPITAL_COMMUNITY): Payer: Self-pay | Admitting: Emergency Medicine

## 2015-07-17 ENCOUNTER — Emergency Department (INDEPENDENT_AMBULATORY_CARE_PROVIDER_SITE_OTHER)
Admission: EM | Admit: 2015-07-17 | Discharge: 2015-07-17 | Disposition: A | Discharge status: Home / Self Care | Payer: Self-pay | Source: Home / Self Care

## 2015-07-17 DIAGNOSIS — K029 Dental caries, unspecified: Secondary | ICD-10-CM

## 2015-07-17 DIAGNOSIS — J111 Influenza due to unidentified influenza virus with other respiratory manifestations: Secondary | ICD-10-CM

## 2015-07-17 MED ORDER — AMOXICILLIN 500 MG PO CAPS: 500.0000 mg | ORAL_CAPSULE | Freq: Three times a day (TID) | ORAL | Status: DC

## 2015-07-17 NOTE — ED Provider Notes (Signed)
CSN: [id number]     Arrival date & time 07/17/15  1812 History   None    Chief Complaint  Patient presents with  . Sore Throat  . Dental Pain   (Consider location/radiation/quality/duration/timing/severity/associated sxs/prior Treatment) HPI Dental pain for the last several days. Has dentist appointment for January.  Taking OTC meds without relief of symptoms. Afraid she has an abscess   Past Medical History  Diagnosis Date  . Asthma     childhood now resolved   History reviewed. No pertinent past surgical history. History reviewed. No pertinent family history. Social History  Substance Use Topics  . Smoking status: Current Every Day Smoker    Types: Cigarettes  . Smokeless tobacco: None  . Alcohol Use: Yes   OB History    No data available     Review of Systems  Constitutional: Negative.   HENT: Positive for dental problem.   Respiratory: Negative.   Cardiovascular: Negative.     Allergies  Review of patient's allergies indicates no known allergies.  Home Medications   Prior to Admission medications   Medication Sig Start Date End Date Taking? Authorizing Provider  amoxicillin (AMOXIL) 500 MG capsule Take 1 capsule (500 mg total) by mouth 3 (three) times daily. 07/17/15   Tharon AquasFrank C Sonny Anthes, PA  ibuprofen (ADVIL,MOTRIN) 800 MG tablet Take 1 tablet (800 mg total) by mouth every 8 (eight) hours as needed for pain. 01/21/12   Domenick GongAshley Mortenson, MD  methocarbamol (ROBAXIN) 500 MG tablet Take 1 tablet (500 mg total) by mouth 2 (two) times daily. 07/24/14   Fayrene HelperBowie Tran, PA-C  norethindrone-ethinyl estradiol 1/35 (ORTHO-NOVUM, NORTREL,CYCLAFEM) tablet 1 pill bid for 1 week then 1 daily until finished pack 11/04/14   Linna HoffJames D Kindl, MD   Meds Ordered and Administered this Visit  Medications - No data to display  BP 145/98 mmHg  Pulse 90  Temp(Src) 98.2 F (36.8 C) (Oral)  Resp 18  SpO2 98%  LMP 07/06/2015 (Exact Date) No data found.   Physical Exam  Constitutional: She  is oriented to person, place, and time. She appears well-developed and well-nourished.  HENT:  Head: Normocephalic and atraumatic.  Widespread dental caries.  No palpable abscess  Eyes: Conjunctivae are normal.  Neck: Normal range of motion. Neck supple.  Pulmonary/Chest: Effort normal and breath sounds normal.  Musculoskeletal: Normal range of motion.  Neurological: She is alert and oriented to person, place, and time.  Skin: Skin is warm and dry.    ED Course  Procedures (including critical care time)  Labs Review Labs Reviewed - No data to display  Imaging Review No results found.   Visual Acuity Review  Right Eye Distance:   Left Eye Distance:   Bilateral Distance:    Right Eye Near:   Left Eye Near:    Bilateral Near:         MDM   1. Pain due to dental caries   2. Flu syndrome     Patient is advised to continue home symptomatic treatment. Prescription for amoxil sent pharmacy patient has indicated. Patient is advised that if there are new or worsening symptoms or attend the emergency department, or contact primary care provider. Instructions of care provided discharged home in stable condition.  THIS NOTE WAS GENERATED USING A VOICE RECOGNITION SOFTWARE PROGRAM. ALL REASONABLE EFFORTS  WERE MADE TO PROOFREAD THIS DOCUMENT FOR ACCURACY.     Tharon AquasFrank C Norberta Stobaugh, PA 07/18/15 [phone]

## 2015-07-17 NOTE — ED Notes (Signed)
[facility] a [facility] of a sore throat, body aches and dental pain that has been ongoing for 1 week.

## 2015-07-17 NOTE — Discharge Instructions (Signed)

## 2015-07-21 ENCOUNTER — Emergency Department (HOSPITAL_COMMUNITY)
Admission: EM | Admit: 2015-07-21 | Discharge: 2015-07-22 | Disposition: A | Discharge status: Home / Self Care | Payer: BLUE CROSS/BLUE SHIELD | Attending: Emergency Medicine | Admitting: Emergency Medicine

## 2015-07-21 ENCOUNTER — Emergency Department (HOSPITAL_COMMUNITY): Payer: BLUE CROSS/BLUE SHIELD

## 2015-07-21 DIAGNOSIS — F17210 Nicotine dependence, cigarettes, uncomplicated: Secondary | ICD-10-CM | POA: Diagnosis not present

## 2015-07-21 DIAGNOSIS — J45909 Unspecified asthma, uncomplicated: Secondary | ICD-10-CM | POA: Insufficient documentation

## 2015-07-21 DIAGNOSIS — Z792 Long term (current) use of antibiotics: Secondary | ICD-10-CM | POA: Diagnosis not present

## 2015-07-21 DIAGNOSIS — R55 Syncope and collapse: Secondary | ICD-10-CM | POA: Diagnosis not present

## 2015-07-21 DIAGNOSIS — M791 Myalgia: Secondary | ICD-10-CM | POA: Insufficient documentation

## 2015-07-21 DIAGNOSIS — J111 Influenza due to unidentified influenza virus with other respiratory manifestations: Secondary | ICD-10-CM

## 2015-07-21 DIAGNOSIS — E860 Dehydration: Secondary | ICD-10-CM | POA: Insufficient documentation

## 2015-07-21 DIAGNOSIS — R69 Illness, unspecified: Secondary | ICD-10-CM

## 2015-07-21 DIAGNOSIS — R05 Cough: Secondary | ICD-10-CM | POA: Diagnosis present

## 2015-07-21 DIAGNOSIS — R110 Nausea: Secondary | ICD-10-CM | POA: Insufficient documentation

## 2015-07-21 LAB — URINALYSIS, ROUTINE W REFLEX MICROSCOPIC
Glucose, UA: NEGATIVE mg/dL
Hgb urine dipstick: NEGATIVE
KETONES UR: NEGATIVE mg/dL
Leukocytes, UA: NEGATIVE
NITRITE: NEGATIVE
PH: 7.5 (ref 5.0–8.0)
PROTEIN: NEGATIVE mg/dL
Specific Gravity, Urine: 1.029 (ref 1.005–1.030)

## 2015-07-21 LAB — BASIC METABOLIC PANEL
ANION GAP: 8 (ref 5–15)
BUN: 15 mg/dL (ref 6–20)
CO2: 24 mmol/L (ref 22–32)
Calcium: 9.1 mg/dL (ref 8.9–10.3)
Chloride: 105 mmol/L (ref 101–111)
Creatinine, Ser: 0.81 mg/dL (ref 0.44–1.00)
GFR calc Af Amer: >60 mL/min (ref >60)
GFR calc non Af Amer: >60 mL/min (ref >60)
GLUCOSE: 108 mg/dL — AB (ref 65–99)
POTASSIUM: 3.7 mmol/L (ref 3.5–5.1)
Sodium: 137 mmol/L (ref 135–145)

## 2015-07-21 LAB — CBC
HEMATOCRIT: 42.3 % (ref 36.0–46.0)
Hemoglobin: 13.7 g/dL (ref 12.0–15.0)
MCH: 28.8 pg (ref 26.0–34.0)
MCHC: 32.4 g/dL (ref 30.0–36.0)
MCV: 88.9 fL (ref 78.0–100.0)
Platelets: 224 10*3/uL (ref 150–400)
RBC: 4.76 MIL/uL (ref 3.87–5.11)
RDW: 14.8 % (ref 11.5–15.5)
WBC: 7.8 10*3/uL (ref 4.0–10.5)

## 2015-07-21 LAB — CBG MONITORING, ED: Glucose-Capillary: 115 mg/dL — ABNORMAL HIGH (ref 65–99)

## 2015-07-21 MED ORDER — KETOROLAC TROMETHAMINE 15 MG/ML IJ SOLN
15.0000 mg | Freq: Once | INTRAMUSCULAR | Status: AC
  Administered 2015-07-22: 15 mg via INTRAVENOUS
  Filled 2015-07-21: qty 1

## 2015-07-21 MED ORDER — SODIUM CHLORIDE 0.9 % IV BOLUS (SEPSIS)
1000.0000 mL | Freq: Once | INTRAVENOUS | Status: AC
  Administered 2015-07-21: 1000 mL via INTRAVENOUS

## 2015-07-21 NOTE — ED Notes (Signed)
Pt states [facility] gave her amoxicillin to treat her tooth and flu like symptoms. Stats she still feels bad. Awakes at night in cold sweats and is very tired. Denies fevers. Pain 4 with body aches. Headache.

## 2015-07-21 NOTE — ED Provider Notes (Signed)
CSN: [id number]     Arrival date & time 07/21/15  1642 History   First MD Initiated Contact with Patient 07/21/15 2116     Chief Complaint  Patient presents with  . Flu-like symptoms      (Consider location/radiation/quality/duration/timing/severity/associated sxs/prior Treatment) Patient is a 40 y.o. female presenting with near-syncope. The history is provided by the patient. No language interpreter was used.  Near Syncope This is a new problem. The current episode started today. The problem has been gradually improving. Associated symptoms include chills, coughing, myalgias and nausea.    Past Medical History  Diagnosis Date  . Asthma     childhood now resolved   No past surgical history on file. No family history on file. Social History  Substance Use Topics  . Smoking status: Current Every Day Smoker    Types: Cigarettes  . Smokeless tobacco: Not on file  . Alcohol Use: Yes   OB History    No data available     Review of Systems  Constitutional: Positive for chills.  Respiratory: Positive for cough.   Cardiovascular: Positive for near-syncope.  Gastrointestinal: Positive for nausea.  Musculoskeletal: Positive for myalgias.  All other systems reviewed and are negative.     Allergies  Review of patient's allergies indicates no known allergies.  Home Medications   Prior to Admission medications   Medication Sig Start Date End Date Taking? Authorizing Provider  amoxicillin (AMOXIL) 500 MG capsule Take 1 capsule (500 mg total) by mouth 3 (three) times daily. 07/17/15  Yes Tharon Aquas, PA  Chlorphen-Pseudoephed-APAP [facility] FLU/COLD PO) Take by mouth.   Yes Historical Provider, MD  HYDROCODONE-ACETAMINOPHEN PO Take 1 tablet by mouth once as needed (flu symptoms/pain.).   Yes Historical Provider, MD   BP 149/93 mmHg  Pulse 89  Temp(Src) 98.9 F (37.2 C) (Oral)  Resp 18  SpO2 100%  LMP 07/06/2015 (Exact Date) Physical Exam  Constitutional: She is  oriented to person, place, and time. She appears well-developed and well-nourished.  HENT:  Head: Normocephalic and atraumatic.  Eyes: Conjunctivae are normal.  Neck: Neck supple.  Cardiovascular: Normal rate and regular rhythm.   Pulmonary/Chest: Effort normal.  Abdominal: Soft. Bowel sounds are normal. She exhibits no distension. There is no tenderness.  Musculoskeletal: She exhibits no edema.  Lymphadenopathy:    She has no cervical adenopathy.  Neurological: She is alert and oriented to person, place, and time.  Skin: Skin is warm and dry.  Psychiatric: She has a normal mood and affect.  Nursing note and vitals reviewed.   ED Course  Procedures (including critical care time) Labs Review Labs Reviewed  BASIC METABOLIC PANEL - Abnormal; Notable for the following:    Glucose, Bld 108 (*)    All other components within normal limits  URINALYSIS, ROUTINE W REFLEX MICROSCOPIC (NOT AT [facility]) - Abnormal; Notable for the following:    Color, Urine AMBER (*)    Bilirubin Urine SMALL (*)    All other components within normal limits  CBG MONITORING, ED - Abnormal; Notable for the following:    Glucose-Capillary 115 (*)    All other components within normal limits  CBC    Imaging Review Dg Chest 2 View  07/21/2015  CLINICAL DATA:  Acute onset of generalized weakness, lethargy, cough and shortness of breath. Congestion. Initial encounter. EXAM: CHEST  2 VIEW COMPARISON:  Chest radiograph performed 10/17/2009 FINDINGS: The lungs are well-aerated and clear. There is no evidence of focal opacification, pleural effusion or  pneumothorax. The heart is normal in size; the mediastinal contour is within normal limits. No acute osseous abnormalities are seen. IMPRESSION: No acute cardiopulmonary process seen. Electronically Signed   By: Roanna RaiderJeffery  Chang M.D.   On: 07/21/2015 23:15   I have personally reviewed and evaluated these images and lab results as part of my medical decision-making.   EKG  Interpretation None     Lab and radiology results reviewed and shared with patient. Patient feels better after IV fluids and medications. MDM   Final diagnoses:  None    Influenza-like illness. Mild dehydration with orthostatic blood pressure changes. Care instructions provided. Return precautions discussed.    Felicie Mornavid Cassondra Stachowski, NP 07/22/15 [id number]  Vanetta MuldersScott Zackowski, MD 07/23/15 2008

## 2015-07-21 NOTE — ED Notes (Signed)
Patient transported to X-ray 

## 2015-07-21 NOTE — ED Notes (Signed)
Pt states that she has had flu like symptoms since Thursday, was seen at urgent care on Friday and dx with flu. Also states that this weekend she passed out and now has a HA. Alert and oriented.

## 2015-07-21 NOTE — ED Notes (Signed)
NP at bedside.

## 2015-07-22 NOTE — Discharge Instructions (Signed)
Dehydration, Adult Dehydration means your body does not have as much fluid or water as it needs. It happens when you take in less fluid than you lose. Your kidneys, brain, and heart will not work properly without the right amount of fluids.  Dehydration can range from mild to severe. It should be treated right away to help prevent it from becoming severe. HOME CARE  Drink enough fluid to keep your pee (urine) clear or pale yellow.  Drink water or fluid slowly by taking small sips. You can also try sucking on ice cubes.  Have food or drinks that contain electrolytes. Examples include bananas and sports drinks.  Take over-the-counter and prescription medicines only as told by your doctor.  Prepare oral rehydration solution (ORS) according to the instructions that came with it. Take sips of ORS every 5 minutes until your pee returns to normal.  If you are throwing up (vomiting) or have watery poop (diarrhea), keep trying to drink water, ORS, or both.  If you have watery poop, avoid:  Drinks with caffeine.  Fruit juice.  Milk.  Carbonated soft drinks.  Do not take salt tablets. This can lead to having too much sodium in your body (hypernatremia). GET HELP IF:  You cannot eat or drink without throwing up.  You have had mild watery poop for longer than 24 hours.  You have a fever. GET HELP RIGHT AWAY IF:   You have very strong thirst.  You have very bad watery poop.  You have not peed in 6-8 hours, or you have peed only a small amount of very dark pee.  You have shriveled skin.  You are dizzy, confused, or both.   This information is not intended to replace advice given to you by your health care provider. Make sure you discuss any questions you have with your health care provider.   Document Released: 04/30/2009 Document Revised: 03/25/2015 Document Reviewed: 11/19/2014 Elsevier Interactive Patient Education 2016 Elsevier Inc. Viral Infections A virus is a type of  germ. Viruses can cause:  Minor sore throats.  Aches and pains.  Headaches.  Runny nose.  Rashes.  Watery eyes.  Tiredness.  Coughs.  Loss of appetite.  Feeling sick to your stomach (nausea).  Throwing up (vomiting).  Watery poop (diarrhea). HOME CARE   Only take medicines as told by your doctor.  Drink enough water and fluids to keep your pee (urine) clear or pale yellow. Sports drinks are a good choice.  Get plenty of rest and eat healthy. Soups and broths with crackers or rice are fine. GET HELP RIGHT AWAY IF:   You have a very bad headache.  You have shortness of breath.  You have chest pain or neck pain.  You have an unusual rash.  You cannot stop throwing up.  You have watery poop that does not stop.  You cannot keep fluids down.  You or your child has a temperature by mouth above 102 F (38.9 C), not controlled by medicine.  Your baby is older than 3 months with a rectal temperature of 102 F (38.9 C) or higher.  Your baby is 65 months old or younger with a rectal temperature of 100.4 F (38 C) or higher. MAKE SURE YOU:   Understand these instructions.  Will watch this condition.  Will get help right away if you are not doing well or get worse.   This information is not intended to replace advice given to you by your health care provider. Make  sure you discuss any questions you have with your health care provider.   Document Released: 06/16/2008 Document Revised: 09/26/2011 Document Reviewed: 12/10/2014 Elsevier Interactive Patient Education [employer] Elsevier Inc[employer]

## 2016-01-20 ENCOUNTER — Ambulatory Visit (HOSPITAL_COMMUNITY)
Admission: EM | Admit: 2016-01-20 | Discharge: 2016-01-20 | Disposition: A | Discharge status: Home / Self Care | Payer: BLUE CROSS/BLUE SHIELD | Attending: Emergency Medicine | Admitting: Emergency Medicine

## 2016-01-20 ENCOUNTER — Encounter (HOSPITAL_COMMUNITY): Payer: Self-pay | Admitting: *Deleted

## 2016-01-20 DIAGNOSIS — K0889 Other specified disorders of teeth and supporting structures: Secondary | ICD-10-CM | POA: Diagnosis not present

## 2016-01-20 DIAGNOSIS — B349 Viral infection, unspecified: Secondary | ICD-10-CM | POA: Diagnosis not present

## 2016-01-20 MED ORDER — AMOXICILLIN-POT CLAVULANATE 875-125 MG PO TABS: 1.0000 | ORAL_TABLET | Freq: Two times a day (BID) | ORAL | Status: DC

## 2016-01-20 NOTE — ED Notes (Signed)
Pt  Reports   Symptoms  Of  Body  Aches  /  Chills  As   Well as     Headache      Nasal  Congestion  And  Toothache    she  Reports  The  Symptoms  Started  This  Am  She   States    Symptoms  Not  releived  By  [facility]c  meds

## 2016-01-20 NOTE — Discharge Instructions (Signed)

## 2016-01-21 NOTE — ED Provider Notes (Signed)
CSN: [id number]     Arrival date & time 01/20/16  1923 History   First MD Initiated Contact with Patient 01/20/16 2053     Chief Complaint  Patient presents with  . Generalized Body Aches   (Consider location/radiation/quality/duration/timing/severity/associated sxs/prior Treatment) HPI Comments: Patient presents with dental pain for over 1 week. Now has body aches, headache, nasal congestion that started this morning. No coughing or fever. Has had dental issues in the past and was seen 6 months ago here for dental infection. Has taken Ibuprofen for symptoms today with minimal relief.   The history is provided by the patient.    Past Medical History  Diagnosis Date  . Asthma     childhood now resolved   History reviewed. No pertinent past surgical history. History reviewed. No pertinent family history. Social History  Substance Use Topics  . Smoking status: Current Every Day Smoker    Types: Cigarettes  . Smokeless tobacco: None  . Alcohol Use: Yes   OB History    No data available     Review of Systems  Constitutional: Positive for chills and fatigue. Negative for fever.  HENT: Positive for congestion, dental problem, rhinorrhea and sore throat.   Respiratory: Negative for cough.   Neurological: Positive for headaches.    Allergies  Review of patient's allergies indicates no known allergies.  Home Medications   Prior to Admission medications   Medication Sig Start Date End Date Taking? Authorizing Provider  amoxicillin-clavulanate (AUGMENTIN) 875-125 MG tablet Take 1 tablet by mouth every 12 (twelve) hours. 01/20/16   Sudie GrumblingAnn Berry Smaran Gaus, NP   Meds Ordered and Administered this Visit  Medications - No data to display  BP 132/88 mmHg  Temp(Src) 98.4 F (36.9 C) (Oral)  Resp 14  SpO2 100%  LMP 01/08/2016 No data found.   Physical Exam  Constitutional: She is oriented to person, place, and time. She appears well-developed and well-nourished. She appears ill.  HENT:   Head: Normocephalic and atraumatic.  Right Ear: Hearing, tympanic membrane, external ear and ear canal normal.  Left Ear: Hearing, tympanic membrane, external ear and ear canal normal.  Nose: Rhinorrhea present. Right sinus exhibits no maxillary sinus tenderness and no frontal sinus tenderness. Left sinus exhibits no maxillary sinus tenderness and no frontal sinus tenderness.  Mouth/Throat: Uvula is midline and mucous membranes are normal. Abnormal dentition. Posterior oropharyngeal erythema present.    Redness, swelling around last upper right molar. Tender. No discharge seen. Tooth in poor repair. Also tender along tooth line near maxillary sinus area.   Neck: Normal range of motion. Neck supple.  Cardiovascular: Normal rate, regular rhythm and normal heart sounds.   Pulmonary/Chest: Effort normal and breath sounds normal. She has no wheezes. She has no rales.  Lymphadenopathy:    She has no cervical adenopathy.  Neurological: She is alert and oriented to person, place, and time.  Skin: Skin is warm and dry.    ED Course  Procedures (including critical care time)  Labs Review Labs Reviewed - No data to display  Imaging Review No results found.   Visual Acuity Review  Right Eye Distance:   Left Eye Distance:   Bilateral Distance:    Right Eye Near:   Left Eye Near:    Bilateral Near:         MDM   1. Pain, dental   2. Viral illness    Probable dental abscess- start Augmentin as directed. Patient needs to follow-up with her dentist within  the next 1-2 weeks. Discussed other symptoms (congestion, headaches, body aches) are probably due to a viral illness. Encouraged to continue OTC Ibuprofen 600mg  every 6 hours as needed for pain. May take OTC decongestants as needed for symptoms. Rest and push fluids.Encouraged to decrease smoking. Follow-up with her PCP if viral illness symptoms do not improve within 7 days.     Sudie GrumblingAnn Berry Wrenn Willcox, NP 01/21/16 [phone]

## 2016-03-27 ENCOUNTER — Encounter (HOSPITAL_COMMUNITY): Payer: Self-pay | Admitting: Emergency Medicine

## 2016-03-27 ENCOUNTER — Ambulatory Visit (HOSPITAL_COMMUNITY)
Admission: EM | Admit: 2016-03-27 | Discharge: 2016-03-27 | Disposition: A | Discharge status: Home / Self Care | Payer: BLUE CROSS/BLUE SHIELD | Attending: Family Medicine | Admitting: Family Medicine

## 2016-03-27 ENCOUNTER — Ambulatory Visit (INDEPENDENT_AMBULATORY_CARE_PROVIDER_SITE_OTHER): Payer: BLUE CROSS/BLUE SHIELD

## 2016-03-27 DIAGNOSIS — J111 Influenza due to unidentified influenza virus with other respiratory manifestations: Secondary | ICD-10-CM

## 2016-03-27 DIAGNOSIS — J209 Acute bronchitis, unspecified: Secondary | ICD-10-CM

## 2016-03-27 DIAGNOSIS — R69 Illness, unspecified: Secondary | ICD-10-CM

## 2016-03-27 DIAGNOSIS — Z720 Tobacco use: Secondary | ICD-10-CM

## 2016-03-27 DIAGNOSIS — Z202 Contact with and (suspected) exposure to infections with a predominantly sexual mode of transmission: Secondary | ICD-10-CM

## 2016-03-27 DIAGNOSIS — M791 Myalgia, unspecified site: Secondary | ICD-10-CM

## 2016-03-27 DIAGNOSIS — J9801 Acute bronchospasm: Secondary | ICD-10-CM

## 2016-03-27 MED ORDER — IPRATROPIUM-ALBUTEROL 0.5-2.5 (3) MG/3ML IN SOLN
3.0000 mL | Freq: Once | RESPIRATORY_TRACT | Status: AC
  Administered 2016-03-27: 3 mL via RESPIRATORY_TRACT

## 2016-03-27 MED ORDER — AZITHROMYCIN 250 MG PO TABS: ORAL_TABLET | ORAL | 0 refills | Status: DC

## 2016-03-27 MED ORDER — IPRATROPIUM-ALBUTEROL 0.5-2.5 (3) MG/3ML IN SOLN
RESPIRATORY_TRACT | Status: AC
Start: 2016-03-27 — End: 2016-03-27
  Filled 2016-03-27: qty 3

## 2016-03-27 MED ORDER — TRIAMCINOLONE ACETONIDE 40 MG/ML IJ SUSP
40.0000 mg | Freq: Once | INTRAMUSCULAR | Status: AC
  Administered 2016-03-27: 40 mg via INTRAMUSCULAR

## 2016-03-27 MED ORDER — ALBUTEROL SULFATE HFA 108 (90 BASE) MCG/ACT IN AERS: 2.0000 | INHALATION_SPRAY | RESPIRATORY_TRACT | 0 refills | Status: DC | PRN

## 2016-03-27 MED ORDER — PHENYLEPHRINE-CHLORPHEN-DM 10-4-12.5 MG/5ML PO LIQD: 5.0000 mL | ORAL | 0 refills | Status: DC | PRN

## 2016-03-27 MED ORDER — TRIAMCINOLONE ACETONIDE 40 MG/ML IJ SUSP
INTRAMUSCULAR | Status: AC
Start: 2016-03-27 — End: 2016-03-27
  Filled 2016-03-27: qty 1

## 2016-03-27 MED ORDER — SODIUM CHLORIDE 0.9 % IN NEBU
INHALATION_SOLUTION | RESPIRATORY_TRACT | Status: AC
Start: 2016-03-27 — End: 2016-03-27
  Filled 2016-03-27: qty 3

## 2016-03-27 MED ORDER — PREDNISONE 20 MG PO TABS: ORAL_TABLET | ORAL | 0 refills | Status: DC

## 2016-03-27 NOTE — Discharge Instructions (Signed)
Drink plenty of fluids and stay well-hydrated. Take the medications prescribed to use as directed. May also take Tylenol or ibuprofen for aches and pains. If you develop fevers, chills, trouble breathing or other new symptoms, vomiting, unable to drink fluids go to the emergency department promptly. Once your feeling better you will need to have a pelvic exam to be tested for STDs. He may go to the health department, see her primary care doctor, or return if worse.

## 2016-03-27 NOTE — ED Triage Notes (Signed)
Patient complains of general body aches.  Patient has not checked for fever at home.  No fever here today.  Denies n/v/d.  Patient reports non-productive cough.  Denies urinary symptoms.

## 2016-03-27 NOTE — ED Provider Notes (Addendum)
CSN: [id number]     Arrival date & time 03/27/16  1643 History   First MD Initiated Contact with Patient 03/27/16 1904     Chief Complaint  Patient presents with  . Cough   (Consider location/radiation/quality/duration/timing/severity/associated sxs/prior Treatment) 40 year old female complaining of fatigue, malaise, myalgias of the legs, back, ribs and chest with chest pain associated with cough for several days. Is complaining of frequent cough. Has sore throat and earaches. Sometimes she feels hot and cold, feels pressure and around her head. She smokes one half pack per day. Room with strong odor of tobacco. Has taken TheraFlu and Alka-Seltzer without relief. No rash, fever, tick exposure, vomiting, diarrhea, abdominal pain.      Past Medical History:  Diagnosis Date  . Asthma    childhood now resolved   History reviewed. No pertinent surgical history. No family history on file. Social History  Substance Use Topics  . Smoking status: Current Every Day Smoker    Types: Cigarettes  . Smokeless tobacco: Not on file  . Alcohol use Yes   OB History    No data available     Review of Systems  Constitutional: Positive for activity change, appetite change and fatigue. Negative for chills and fever.  HENT: Positive for congestion, postnasal drip, rhinorrhea, sinus pressure and sore throat. Negative for facial swelling.   Eyes: Negative.   Respiratory: Positive for cough and shortness of breath.   Cardiovascular: Positive for chest pain.  Gastrointestinal: Negative.   Genitourinary: Negative for difficulty urinating, dysuria, frequency and pelvic pain.  Musculoskeletal: Negative for neck pain and neck stiffness.  Skin: Negative for pallor and rash.  Neurological: Negative.   All other systems reviewed and are negative.   Allergies  Review of patient's allergies indicates no known allergies.  Home Medications   Prior to Admission medications   Medication Sig Start Date  End Date Taking? Authorizing Provider  ibuprofen (ADVIL,MOTRIN) 400 MG tablet Take 400 mg by mouth every 6 (six) hours as needed.   Yes Historical Provider, MD  sodium-potassium bicarbonate (ALKA-SELTZER GOLD) TBEF dissolvable tablet Take 1 tablet by mouth daily as needed.   Yes Historical Provider, MD  albuterol (PROVENTIL HFA;VENTOLIN HFA) 108 (90 Base) MCG/ACT inhaler Inhale 2 puffs into the lungs every 4 (four) hours as needed for wheezing or shortness of breath. 03/27/16   [name], NP  amoxicillin-clavulanate (AUGMENTIN) 875-125 MG tablet Take 1 tablet by mouth every 12 (twelve) hours. Patient not taking: Reported on 03/27/2016 01/20/16   [name], NP  azithromycin (ZITHROMAX) 250 MG tablet 2 tabs po on day one, then one tablet po once daily on days 2-5. 03/27/16   [name], NP  Phenylephrine-Chlorphen-DM 04-21-11.5 MG/5ML LIQD Take 5 mLs by mouth every 4 (four) hours as needed. 03/27/16   [name], NP  predniSONE (DELTASONE) 20 MG tablet Take 3 tabs po on first day, 2 tabs second day, 2 tabs third day, 1 tab fourth day, 1 tab 5th day. Take with food. 03/27/16   [name], NP   Meds Ordered and Administered this Visit   Medications  ipratropium-albuterol (DUONEB) 0.5-2.5 (3) MG/3ML nebulizer solution 3 mL (3 mLs Nebulization Given 03/27/16 1939)  triamcinolone acetonide (KENALOG-40) injection 40 mg (40 mg Intramuscular Given 03/27/16 1938)    BP 119/86 (BP Location: Right Arm)   Pulse 116   Temp 98.8 F (37.1 C) (Oral)   Resp 20   LMP 03/07/2016   SpO2 98%  No data found.  Physical Exam  Constitutional: She is oriented to person, place, and time. She appears well-developed and well-nourished. No distress.  HENT:  Head: Normocephalic.  Right Ear: External ear normal.  Left Ear: External ear normal.  Mouth/Throat: No oropharyngeal exudate.   Bilateral TMs are normal. Oropharynx with minor erythema. No exudates.  Eyes: EOM are normal.  Neck: Normal range of motion. Neck  supple.  Cardiovascular: Normal rate and regular rhythm.   Pulmonary/Chest: Effort normal. No respiratory distress. She has wheezes. She has no rales. She exhibits tenderness.  Slightly diminished breath sounds bilaterally. Expiratory wheezes bilaterally. Loose cough.  Musculoskeletal: Normal range of motion.  Lymphadenopathy:    She has no cervical adenopathy.  Neurological: She is alert and oriented to person, place, and time. No cranial nerve deficit.  Skin: Skin is warm and dry.  Psychiatric: She has a normal mood and affect.  Nursing note and vitals reviewed.   Urgent Care Course   Clinical Course    Procedures (including critical care time)  Labs Review Labs Reviewed - No data to display  Imaging Review Dg Chest 2 View  Result Date: 03/27/2016 CLINICAL DATA:  Cough, body aches, chills and fever. EXAM: CHEST  2 VIEW COMPARISON:  07/21/2015 FINDINGS: The heart size and mediastinal contours are within normal limits. Both lungs are clear. The visualized skeletal structures are unremarkable. IMPRESSION: No active cardiopulmonary disease. Electronically Signed   By: Signa Kellaylor  Stroud M.D.   On: 03/27/2016 20:19     Visual Acuity Review  Right Eye Distance:   Left Eye Distance:   Bilateral Distance:    Right Eye Near:   Left Eye Near:    Bilateral Near:         MDM   1. Acute bronchitis, unspecified organism   2. Cough due to bronchospasm   3. Influenza-like illness   4. Myalgia   5. Tobacco abuse disorder   6. Possible exposure to STD    Drink plenty of fluids and stay well-hydrated. Take the medications prescribed to use as directed. May also take Tylenol or ibuprofen for aches and pains. If you develop fevers, chills, trouble breathing or other new symptoms, vomiting, unable to drink fluids go to the emergency department promptly. Once your feeling better you will need to have a pelvic exam to be tested for STDs. You may go to the health department, see your  primary care doctor, or return if worse. Meds ordered this encounter  Medications  . sodium-potassium bicarbonate (ALKA-SELTZER GOLD) TBEF dissolvable tablet    Sig: Take 1 tablet by mouth daily as needed.  Marland Kitchen. ibuprofen (ADVIL,MOTRIN) 400 MG tablet    Sig: Take 400 mg by mouth every 6 (six) hours as needed.  Marland Kitchen. ipratropium-albuterol (DUONEB) 0.5-2.5 (3) MG/3ML nebulizer solution 3 mL  . triamcinolone acetonide (KENALOG-40) injection 40 mg  . albuterol (PROVENTIL HFA;VENTOLIN HFA) 108 (90 Base) MCG/ACT inhaler    Sig: Inhale 2 puffs into the lungs every 4 (four) hours as needed for wheezing or shortness of breath.    Dispense:  1 Inhaler    Refill:  0    Order Specific Question:   Supervising Provider    Answer:   Elvina SidleLAUENSTEIN, KURT [[id number]]  . predniSONE (DELTASONE) 20 MG tablet    Sig: Take 3 tabs po on first day, 2 tabs second day, 2 tabs third day, 1 tab fourth day, 1 tab 5th day. Take with food.    Dispense:  9 tablet    Refill:  0  Order Specific Question:   Supervising Provider    Answer:   Elvina Sidle [[id number]]  . azithromycin (ZITHROMAX) 250 MG tablet    Sig: 2 tabs po on day one, then one tablet po once daily on days 2-5.    Dispense:  6 tablet    Refill:  0    Order Specific Question:   Supervising Provider    Answer:   Elvina Sidle [[id number]]  . Phenylephrine-Chlorphen-DM 04-21-11.5 MG/5ML LIQD    Sig: Take 5 mLs by mouth every 4 (four) hours as needed.    Dispense:  120 mL    Refill:  0    Order Specific Question:   Supervising Provider    Answer:   Elvina Sidle [[id number]]   Stop smoking.     [name], NP 03/27/16 1610    [name], NP 03/27/16 9604    [name], NP 03/27/16 2102

## 2016-04-01 ENCOUNTER — Emergency Department (HOSPITAL_COMMUNITY)
Admission: EM | Admit: 2016-04-01 | Discharge: 2016-04-02 | Disposition: A | Discharge status: Home / Self Care | Payer: BLUE CROSS/BLUE SHIELD | Attending: Emergency Medicine | Admitting: Emergency Medicine

## 2016-04-01 ENCOUNTER — Emergency Department (HOSPITAL_COMMUNITY): Payer: BLUE CROSS/BLUE SHIELD

## 2016-04-01 ENCOUNTER — Encounter (HOSPITAL_COMMUNITY): Payer: Self-pay | Admitting: Emergency Medicine

## 2016-04-01 DIAGNOSIS — Z79899 Other long term (current) drug therapy: Secondary | ICD-10-CM | POA: Insufficient documentation

## 2016-04-01 DIAGNOSIS — F17210 Nicotine dependence, cigarettes, uncomplicated: Secondary | ICD-10-CM | POA: Diagnosis not present

## 2016-04-01 DIAGNOSIS — R0602 Shortness of breath: Secondary | ICD-10-CM | POA: Diagnosis present

## 2016-04-01 DIAGNOSIS — J209 Acute bronchitis, unspecified: Secondary | ICD-10-CM | POA: Diagnosis not present

## 2016-04-01 DIAGNOSIS — Z7951 Long term (current) use of inhaled steroids: Secondary | ICD-10-CM | POA: Diagnosis not present

## 2016-04-01 DIAGNOSIS — J45909 Unspecified asthma, uncomplicated: Secondary | ICD-10-CM | POA: Insufficient documentation

## 2016-04-01 DIAGNOSIS — R0789 Other chest pain: Secondary | ICD-10-CM

## 2016-04-01 NOTE — ED Triage Notes (Signed)
Pt was given a z-pack and prednisone which she is currently taking.  States she isn't improving.  Lungs CTA

## 2016-04-01 NOTE — ED Triage Notes (Signed)
Pt presents to the ED with persistent cough/shortness of breath.  Was seen at Urgent Care on Sunday and diagnosed with bronchitis, bronchospasm, and a viral illness.  Complains of left sided rib pain with movement that has become worse over the past few days.

## 2016-04-02 MED ORDER — HYDROCODONE-ACETAMINOPHEN 5-325 MG PO TABS: 1.0000 | ORAL_TABLET | ORAL | 0 refills | Status: DC | PRN

## 2016-04-02 NOTE — Discharge Instructions (Signed)
Continue using your inhaler as needed. Take the last dose of the antibiotic and prednisone tomorrow. Continue taking ibuprofen as needed for pain. He may take acetaminophen along with the ibuprofen to help with pain control. [facility] for use at night to help sleep, and for times when the pain is not being adequately controlled with ibuprofen and acetaminophen.

## 2016-04-02 NOTE — ED Provider Notes (Signed)
[facility]-EMERGENCY DEPT Provider Note   CSN: [id number] Arrival date & time: 04/01/16  2136  By signing my name below, I, Kristen Zimmerman, attest that this documentation has been prepared under the direction and in the presence of [name], MD. Electronically Signed: Nelwyn Zimmerman, Scribe. 04/02/2016. 12:42 Am.  History   Chief Complaint Chief Complaint  Patient presents with  . Shortness of Breath   The history is provided by the patient. No language interpreter was used.    HPI Comments:  Kristen Zimmerman is a 40 y.o. female who presents to the Emergency Department complaining of sudden-onset unchanged left-sided chest pain beginning 3 days ago. She describes the pain as a sharp, 8/10, pain, exacerbated by breathing and coughing. Pt reports that she was seen at urgent care 6 days ago with cold-like symptoms and was told that she had a viral infection and returns to the ED today having developed new pain. She endorses associated brown, productive cough, chills, and diaphoresis. She denies any recent fever or shortness of breath.   Past Medical History:  Diagnosis Date  . Asthma    childhood now resolved    There are no active problems to display for this patient.   History reviewed. No pertinent surgical history.  OB History    No data available       Home Medications    Prior to Admission medications   Medication Sig Start Date End Date Taking? Authorizing Provider  albuterol (PROVENTIL HFA;VENTOLIN HFA) 108 (90 Base) MCG/ACT inhaler Inhale 2 puffs into the lungs every 4 (four) hours as needed for wheezing or shortness of breath. 03/27/16   [name], NP  amoxicillin-clavulanate (AUGMENTIN) 875-125 MG tablet Take 1 tablet by mouth every 12 (twelve) hours. Patient not taking: Reported on 03/27/2016 01/20/16   [name], NP  azithromycin (ZITHROMAX) 250 MG tablet 2 tabs po on day one, then one tablet po once daily on days 2-5. 03/27/16   [name], NP  ibuprofen  (ADVIL,MOTRIN) 400 MG tablet Take 400 mg by mouth every 6 (six) hours as needed.    Historical Provider, MD  Phenylephrine-Chlorphen-DM 04-21-11.5 MG/5ML LIQD Take 5 mLs by mouth every 4 (four) hours as needed. 03/27/16   [name], NP  predniSONE (DELTASONE) 20 MG tablet Take 3 tabs po on first day, 2 tabs second day, 2 tabs third day, 1 tab fourth day, 1 tab 5th day. Take with food. 03/27/16   [name], NP  sodium-potassium bicarbonate (ALKA-SELTZER GOLD) TBEF dissolvable tablet Take 1 tablet by mouth daily as needed.    Historical Provider, MD    Family History No family history on file.  Social History Social History  Substance Use Topics  . Smoking status: Current Every Day Smoker    Types: Cigarettes  . Smokeless tobacco: Never Used  . Alcohol use Yes     Allergies   Review of patient's allergies indicates no known allergies.   Review of Systems Review of Systems  Constitutional: Positive for chills and diaphoresis. Negative for fever.  Respiratory: Positive for cough. Negative for shortness of breath.   All other systems reviewed and are negative.    Physical Exam Updated Vital Signs BP 154/96 (BP Location: Left Arm)   Pulse 90   Temp 98.4 F (36.9 C) (Oral)   Resp 20   Ht 5\' 6"  (1.676 m)   Wt 170 lb (77.1 kg)   LMP 03/07/2016   SpO2 100%   BMI 27.44 kg/m  Physical Exam  Constitutional: She appears well-developed and well-nourished. No distress.  HENT:  Head: Normocephalic and atraumatic.  Eyes: Conjunctivae are normal.  Neck: Neck supple.  Cardiovascular: Normal rate and regular rhythm.   No murmur heard. Pulmonary/Chest: Effort normal and breath sounds normal. No respiratory distress.  Abdominal: Soft. There is no tenderness.  Musculoskeletal: She exhibits no edema.  Tender left lateral chest wall. No crepitus  Neurological: She is alert.  Skin: Skin is warm and dry.  Psychiatric: She has a normal mood and affect.  Nursing note and vitals  reviewed.    ED Treatments / Results  DIAGNOSTIC STUDIES:  Oxygen Saturation is 100% on Ra, Normal by my interpretation.    COORDINATION OF CARE:  12:53 AM Discussed treatment plan with pt at bedside which included painkillers and imaging and pt agreed to plan.   Radiology Dg Chest 2 View  Result Date: 04/01/2016 CLINICAL DATA:  Acute onset of cough and shortness of breath. Left-sided rib pain. Initial encounter. EXAM: CHEST  2 VIEW COMPARISON:  Chest radiograph performed 03/27/2016 FINDINGS: The lungs are well-aerated and clear. There is no evidence of focal opacification, pleural effusion or pneumothorax. The heart is normal in size; the mediastinal contour is within normal limits. No acute osseous abnormalities are seen. IMPRESSION: No acute cardiopulmonary process seen. Electronically Signed   By: Roanna RaiderJeffery  Chang M.D.   On: 04/01/2016 22:46    Procedures Procedures (including critical care time)  Medications Ordered in ED Medications - No data to display   Initial Impression / Assessment and Plan / ED Course  I have reviewed the triage vital signs and the nursing notes.  Pertinent labs & imaging results that were available during my care of the patient were reviewed by me and considered in my medical decision making (see chart for details).  Clinical Course   Acute bronchitis with chest wall pain. Old records reviewed into it she was seen on September 10 at urgent care and diagnosed with bronchitis and treated with prednisone, azithromycin, and albuterol inhaler. Chest x-ray today shows no evidence of pneumonia and no evidence of rib fracture. It is certainly possible she has an occult rib fracture, but that would not change treatment. Today, lungs are clear where she had wheezing at the urgent care visit. She is to complete her course of prednisone and azithromycin with last dose tomorrow. She has been taking ibuprofen which she is to continue. Given prescription for  hydrocodone-acetaminophen for more severe pain and also for cough suppression.  Final Clinical Impressions(s) / ED Diagnoses   Final diagnoses:  Acute bronchitis, unspecified organism  Left-sided chest wall pain    New Prescriptions New Prescriptions   HYDROCODONE-ACETAMINOPHEN (NORCO) 5-325 MG TABLET    Take 1 tablet by mouth every 4 (four) hours as needed for moderate pain (or coughing).    I personally performed the services described in this documentation, which was scribed in my presence. The recorded information has been reviewed and is accurate.      Dione Boozeavid Xzaiver Vayda, MD 04/02/16 [id number]

## 2016-07-01 ENCOUNTER — Encounter (HOSPITAL_COMMUNITY): Payer: Self-pay | Admitting: Emergency Medicine

## 2016-07-01 ENCOUNTER — Emergency Department (HOSPITAL_COMMUNITY)
Admission: EM | Admit: 2016-07-01 | Discharge: 2016-07-02 | Disposition: A | Discharge status: Home / Self Care | Payer: BLUE CROSS/BLUE SHIELD | Attending: Emergency Medicine | Admitting: Emergency Medicine

## 2016-07-01 ENCOUNTER — Ambulatory Visit (HOSPITAL_COMMUNITY)
Admission: EM | Admit: 2016-07-01 | Discharge: 2016-07-01 | Disposition: A | Discharge status: Nursing Home (Includes ICF) | Payer: BLUE CROSS/BLUE SHIELD | Attending: Internal Medicine | Admitting: Internal Medicine

## 2016-07-01 ENCOUNTER — Emergency Department (HOSPITAL_COMMUNITY): Payer: BLUE CROSS/BLUE SHIELD

## 2016-07-01 DIAGNOSIS — F17210 Nicotine dependence, cigarettes, uncomplicated: Secondary | ICD-10-CM | POA: Insufficient documentation

## 2016-07-01 DIAGNOSIS — R1084 Generalized abdominal pain: Secondary | ICD-10-CM | POA: Diagnosis present

## 2016-07-01 DIAGNOSIS — R17 Unspecified jaundice: Secondary | ICD-10-CM

## 2016-07-01 DIAGNOSIS — R1011 Right upper quadrant pain: Secondary | ICD-10-CM

## 2016-07-01 DIAGNOSIS — R1010 Upper abdominal pain, unspecified: Secondary | ICD-10-CM

## 2016-07-01 DIAGNOSIS — J45909 Unspecified asthma, uncomplicated: Secondary | ICD-10-CM | POA: Diagnosis not present

## 2016-07-01 LAB — COMPREHENSIVE METABOLIC PANEL
ALBUMIN: 4.1 g/dL (ref 3.5–5.0)
ALT: 34 U/L (ref 14–54)
AST: 40 U/L (ref 15–41)
Alkaline Phosphatase: 103 U/L (ref 38–126)
Anion gap: 13 (ref 5–15)
BUN: 12 mg/dL (ref 6–20)
CHLORIDE: 99 mmol/L — AB (ref 101–111)
CO2: 25 mmol/L (ref 22–32)
Calcium: 9.5 mg/dL (ref 8.9–10.3)
Creatinine, Ser: 0.89 mg/dL (ref 0.44–1.00)
GFR calc Af Amer: >60 mL/min (ref >60)
GFR calc non Af Amer: >60 mL/min (ref >60)
GLUCOSE: 68 mg/dL (ref 65–99)
POTASSIUM: 3.9 mmol/L (ref 3.5–5.1)
Sodium: 137 mmol/L (ref 135–145)
Total Bilirubin: 2.1 mg/dL — ABNORMAL HIGH (ref 0.3–1.2)
Total Protein: 7.6 g/dL (ref 6.5–8.1)

## 2016-07-01 LAB — URINALYSIS, ROUTINE W REFLEX MICROSCOPIC
BACTERIA UA: NONE SEEN
BILIRUBIN URINE: NEGATIVE
Glucose, UA: NEGATIVE mg/dL
Hgb urine dipstick: NEGATIVE
Ketones, ur: 80 mg/dL — AB
LEUKOCYTES UA: NEGATIVE
Nitrite: NEGATIVE
Protein, ur: 30 mg/dL — AB
SPECIFIC GRAVITY, URINE: 1.029 (ref 1.005–1.030)
pH: 5 (ref 5.0–8.0)

## 2016-07-01 LAB — CBC
HEMATOCRIT: 42.5 % (ref 36.0–46.0)
Hemoglobin: 14.1 g/dL (ref 12.0–15.0)
MCH: 28.8 pg (ref 26.0–34.0)
MCHC: 33.2 g/dL (ref 30.0–36.0)
MCV: 86.7 fL (ref 78.0–100.0)
Platelets: 280 10*3/uL (ref 150–400)
RBC: 4.9 MIL/uL (ref 3.87–5.11)
RDW: 13.6 % (ref 11.5–15.5)
WBC: 10.1 10*3/uL (ref 4.0–10.5)

## 2016-07-01 LAB — LIPASE, BLOOD: Lipase: 19 U/L (ref 11–51)

## 2016-07-01 LAB — POC URINE PREG, ED: PREG TEST UR: NEGATIVE

## 2016-07-01 MED ORDER — PANTOPRAZOLE SODIUM 40 MG PO TBEC
40.0000 mg | DELAYED_RELEASE_TABLET | Freq: Once | ORAL | Status: AC
Start: 2016-07-01 — End: 2016-07-01
  Administered 2016-07-01: 40 mg via ORAL
  Filled 2016-07-01: qty 1

## 2016-07-01 MED ORDER — ONDANSETRON HCL 4 MG PO TABS: 4.0000 mg | ORAL_TABLET | Freq: Four times a day (QID) | ORAL | 0 refills | Status: DC | PRN

## 2016-07-01 MED ORDER — PANTOPRAZOLE SODIUM 40 MG PO TBEC: 40.0000 mg | DELAYED_RELEASE_TABLET | Freq: Every day | ORAL | 0 refills | Status: DC

## 2016-07-01 MED ORDER — OXYCODONE-ACETAMINOPHEN 5-325 MG PO TABS: 1.0000 | ORAL_TABLET | ORAL | 0 refills | Status: DC | PRN

## 2016-07-01 NOTE — Discharge Instructions (Signed)
See the gastroenterologist to evaluate your response to the medication, and to consider additional tests if you are not improving. Return to the ED of pain is not being adequately controlled.  Avoid fatty and spicy foods, and anything else that makes your pain worse.  Stop smoking - smoking can make a lot of stomach problems worse.

## 2016-07-01 NOTE — ED Triage Notes (Signed)
C/o generalized abd pain and bilateral flank pain with nausea and vomiting.  States symptoms started Wednesday after getting really upset and every time she tries to eat she starts dry heaving and vomiting.  Pt tearful at triage.  Denies urinary complaints.

## 2016-07-01 NOTE — ED Triage Notes (Signed)
Pt reports intermittent vomiting episodes onset 3 days associated w/abd pain and acid reflux and decreased appetite  Reports sx increase when she gets upset/nervous  A&O x4... NAD

## 2016-07-01 NOTE — ED Notes (Signed)
Patient transported to Ultrasound 

## 2016-07-01 NOTE — ED Provider Notes (Signed)
[facility]-EMERGENCY DEPT Provider Note   CSN: [id number] Arrival date & time: 07/01/16  1925     History   Chief Complaint Chief Complaint  Patient presents with  . Abdominal Pain    HPI Kristen Zimmerman is a 40 y.o. female.  40 year old female comes in with approximately 3 day history of generalized abdominal pain. There is associated nausea but no vomiting. Pain is worse after eating-especially greasy or spicy foods. She denies fever or chills. She went to urgent care where she was told to come to the ED for evaluation for possible gallbladder disease. She has not done anything to treat her pain at home. Currently, she rates pain at 4/10.   The history is provided by the patient.  Abdominal Pain      Past Medical History:  Diagnosis Date  . Asthma    childhood now resolved    There are no active problems to display for this patient.   History reviewed. No pertinent surgical history.  OB History    No data available       Home Medications    Prior to Admission medications   Medication Sig Start Date End Date Taking? Authorizing Provider  albuterol (PROVENTIL HFA;VENTOLIN HFA) 108 (90 Base) MCG/ACT inhaler Inhale 2 puffs into the lungs every 4 (four) hours as needed for wheezing or shortness of breath. 03/27/16   Hayden Rasmussenavid Mabe, NP  azithromycin (ZITHROMAX) 250 MG tablet 2 tabs po on day one, then one tablet po once daily on days 2-5. 03/27/16   Hayden Rasmussenavid Mabe, NP  HYDROcodone-acetaminophen (NORCO) 5-325 MG tablet Take 1 tablet by mouth every 4 (four) hours as needed for moderate pain (or coughing). 04/02/16   Dione Boozeavid Stavroula Rohde, MD  ibuprofen (ADVIL,MOTRIN) 400 MG tablet Take 400 mg by mouth every 6 (six) hours as needed.    Historical Provider, MD  Phenylephrine-Chlorphen-DM 04-21-11.5 MG/5ML LIQD Take 5 mLs by mouth every 4 (four) hours as needed. 03/27/16   Hayden Rasmussenavid Mabe, NP  predniSONE (DELTASONE) 20 MG tablet Take 3 tabs po on first day, 2 tabs second day, 2 tabs third day, 1 tab  fourth day, 1 tab 5th day. Take with food. 03/27/16   Hayden Rasmussenavid Mabe, NP  sodium-potassium bicarbonate (ALKA-SELTZER GOLD) TBEF dissolvable tablet Take 1 tablet by mouth daily as needed.    Historical Provider, MD    Family History No family history on file.  Social History Social History  Substance Use Topics  . Smoking status: Current Every Day Smoker    Types: Cigarettes  . Smokeless tobacco: Never Used  . Alcohol use Yes     Allergies   Patient has no known allergies.   Review of Systems Review of Systems  Gastrointestinal: Positive for abdominal pain.  All other systems reviewed and are negative.    Physical Exam Updated Vital Signs BP 155/97 (BP Location: Right Arm)   Pulse 101   Temp 98.8 F (37.1 C) (Oral)   Resp 18   Ht 5\' 6"  (1.676 m)   Wt 170 lb (77.1 kg)   LMP 06/22/2016   SpO2 100%   BMI 27.44 kg/m   Physical Exam  Nursing note and vitals reviewed.  40 year old female, resting comfortably and in no acute distress. Vital signs are significant for hypertension. Oxygen saturation is 100%, which is normal. Head is normocephalic and atraumatic. PERRLA, EOMI. Oropharynx is clear. Neck is nontender and supple without adenopathy or JVD. Back is nontender and there is no CVA tenderness. Lungs are  clear without rales, wheezes, or rhonchi. Chest is nontender. Heart has regular rate and rhythm without murmur. Abdomen is soft, flat, with moderate epigastric and right upper quadrant tenderness with a positive Murphy sign. There is no rebound or guarding. There are no masses or hepatosplenomegaly and peristalsis is hypoactive. Extremities have no cyanosis or edema, full range of motion is present. Skin is warm and dry without rash. Neurologic: Mental status is normal, cranial nerves are intact, there are no motor or sensory deficits.  ED Treatments / Results  Labs (all labs ordered are listed, but only abnormal results are displayed) Labs Reviewed  COMPREHENSIVE  METABOLIC PANEL - Abnormal; Notable for the following:       Result Value   Chloride 99 (*)    Total Bilirubin 2.1 (*)    All other components within normal limits  URINALYSIS, ROUTINE W REFLEX MICROSCOPIC - Abnormal; Notable for the following:    Ketones, ur 80 (*)    Protein, ur 30 (*)    Squamous Epithelial / LPF 0-5 (*)    All other components within normal limits  LIPASE, BLOOD  CBC  POC URINE PREG, ED    Radiology [place] Abdomen Complete  Result Date: 07/01/2016 CLINICAL DATA:  Initial evaluation for acute abdominal pain for 3 days. EXAM: ABDOMEN ULTRASOUND COMPLETE COMPARISON:  None. FINDINGS: Gallbladder: No gallstones or wall thickening visualized. No sonographic Murphy sign noted by sonographer. Common bile duct: Diameter: 2.6 mm Liver: No focal lesion identified. Within normal limits in parenchymal echogenicity. IVC: No abnormality visualized. Pancreas: Visualized portion unremarkable. Spleen: Size and appearance within normal limits. Right Kidney: Length: 11.5 cm. Echogenicity within normal limits. No mass or hydronephrosis visualized. Left Kidney: Length: 10.4 cm. Echogenicity within normal limits. No mass or hydronephrosis visualized. Abdominal aorta: No aneurysm visualized. Other findings: None. IMPRESSION: Normal abdominal ultrasound.  No acute abnormality identified. Electronically Signed   By: Rise MuBenjamin  McClintock M.D.   On: 07/01/2016 23:24    Procedures Procedures (including critical care time)  Medications Ordered in ED Medications  pantoprazole (PROTONIX) EC tablet 40 mg (not administered)     Initial Impression / Assessment and Plan / ED Course  I have reviewed the triage vital signs and the nursing notes.  Pertinent labs & imaging results that were available during my care of the patient were reviewed by me and considered in my medical decision making (see chart for details).  Clinical Course    Abdominal pain worrisome for biliary colic. Old records are  reviewed, confirming urgent care visit earlier today. Laboratory workup shows mildly elevated bilirubin but normal transaminases and alkaline phosphatase. WBC is normal. Abdominal ultrasound has been ordered.  Ultrasound shows no evidence of cholelithiasis. Still need to consider possibility of a calculus cholecystitis. She is discharged with prescription for pantoprazole, ondansetron, and a small number of oxycodone-acetaminophen tablets. She is referred to gastroenterology for consideration of possible endoscopy, possible HIDA scan. Return precautions discussed.  Final Clinical Impressions(s) / ED Diagnoses   Final diagnoses:  Upper abdominal pain  Total bilirubin, elevated    New Prescriptions New Prescriptions   ONDANSETRON (ZOFRAN) 4 MG TABLET    Take 1 tablet (4 mg total) by mouth every 6 (six) hours as needed for nausea or vomiting.   OXYCODONE-ACETAMINOPHEN (PERCOCET) 5-325 MG TABLET    Take 1 tablet by mouth every 4 (four) hours as needed for moderate pain.   PANTOPRAZOLE (PROTONIX) 40 MG TABLET    Take 1 tablet (40 mg total) by mouth  daily.     [name], MD 07/01/16 [phone]

## 2016-07-01 NOTE — ED Notes (Signed)
Advised to  Remain npo

## 2016-07-01 NOTE — ED Provider Notes (Signed)
CSN: [id number]     Arrival date & time 07/01/16  1745 History   First MD Initiated Contact with Patient 07/01/16 1851     Chief Complaint  Patient presents with  . Emesis   (Consider location/radiation/quality/duration/timing/severity/associated sxs/prior Treatment) Patient presents with abdominal pain for 2 days, worsened with eating. States she has not had anything to eat or drink since Wednesday and that she vomits whenever she tries. She is nauseous, denies diarrhea or constipation, last BM 2 days ago, denies hematemesis.    The history is provided by the patient.  Abdominal Pain  Pain location:  RUQ Pain quality: cramping, sharp and stabbing   Pain radiates to:  R flank and L flank Pain severity:  Severe Onset quality:  Gradual Duration:  3 days Timing:  Constant Progression:  Worsening Chronicity:  New Context: eating and retching   Relieved by:  Position changes Worsened by:  Eating Ineffective treatments:  Antacids, lying down and vomiting Associated symptoms: chills, nausea and vomiting   Associated symptoms: no chest pain, no constipation, no cough, no diarrhea, no fever, no hematemesis and no shortness of breath     Past Medical History:  Diagnosis Date  . Asthma    childhood now resolved   History reviewed. No pertinent surgical history. History reviewed. No pertinent family history. Social History  Substance Use Topics  . Smoking status: Current Every Day Smoker    Types: Cigarettes  . Smokeless tobacco: Never Used  . Alcohol use Yes   OB History    No data available     Review of Systems  Constitutional: Positive for chills. Negative for fever.  Respiratory: Negative for cough and shortness of breath.   Cardiovascular: Negative for chest pain.  Gastrointestinal: Positive for abdominal pain, nausea and vomiting. Negative for constipation, diarrhea and hematemesis.  Endocrine: Positive for heat intolerance.  Genitourinary: Positive for flank pain.      Allergies  Patient has no known allergies.  Home Medications   Prior to Admission medications   Medication Sig Start Date End Date Taking? Authorizing Provider  albuterol (PROVENTIL HFA;VENTOLIN HFA) 108 (90 Base) MCG/ACT inhaler Inhale 2 puffs into the lungs every 4 (four) hours as needed for wheezing or shortness of breath. 03/27/16  Yes Hayden Rasmussenavid Mabe, NP  azithromycin (ZITHROMAX) 250 MG tablet 2 tabs po on day one, then one tablet po once daily on days 2-5. 03/27/16   Hayden Rasmussenavid Mabe, NP  HYDROcodone-acetaminophen (NORCO) 5-325 MG tablet Take 1 tablet by mouth every 4 (four) hours as needed for moderate pain (or coughing). 04/02/16   Dione Boozeavid Glick, MD  ibuprofen (ADVIL,MOTRIN) 400 MG tablet Take 400 mg by mouth every 6 (six) hours as needed.    Historical Provider, MD  Phenylephrine-Chlorphen-DM 04-21-11.5 MG/5ML LIQD Take 5 mLs by mouth every 4 (four) hours as needed. 03/27/16   Hayden Rasmussenavid Mabe, NP  predniSONE (DELTASONE) 20 MG tablet Take 3 tabs po on first day, 2 tabs second day, 2 tabs third day, 1 tab fourth day, 1 tab 5th day. Take with food. 03/27/16   Hayden Rasmussenavid Mabe, NP  sodium-potassium bicarbonate (ALKA-SELTZER GOLD) TBEF dissolvable tablet Take 1 tablet by mouth daily as needed.    Historical Provider, MD   Meds Ordered and Administered this Visit  Medications - No data to display  BP 138/93 (BP Location: Left Arm)   Pulse 99   Temp 98.7 F (37.1 C) (Oral)   Resp 16   LMP 06/28/2016   SpO2 100%  No data  found.   Physical Exam  Constitutional: She is oriented to person, place, and time. She appears well-developed and well-nourished. She appears distressed.  HENT:  Head: Normocephalic.  Eyes: Pupils are equal, round, and reactive to light.  Cardiovascular: Normal rate, regular rhythm and normal heart sounds.   Pulses:      Radial pulses are 2+ on the right side.  Pulmonary/Chest: Effort normal and breath sounds normal.  Abdominal: Soft. Normal appearance. She exhibits no shifting  dullness, no distension, no pulsatile liver, no fluid wave, no abdominal bruit, no ascites, no pulsatile midline mass and no mass. Bowel sounds are increased. There is tenderness in the right upper quadrant. There is guarding and positive Murphy's sign. There is no rebound and no tenderness at McBurney's point.    Neurological: She is alert and oriented to person, place, and time.  Skin: Skin is warm and dry. Capillary refill takes less than 2 seconds. She is not diaphoretic.  Nursing note and vitals reviewed.   Urgent Care Course   Clinical Course     Procedures (including critical care time)  Labs Review Labs Reviewed - No data to display  Imaging Review No results found.   Visual Acuity Review  Right Eye Distance:   Left Eye Distance:   Bilateral Distance:    Right Eye Near:   Left Eye Near:    Bilateral Near:         MDM   1. Acute abdominal pain in right upper quadrant    Transfer to the ED for acute abdominal pain, right upper quadrant, positive BlueLinxMurphy Sign.    Dorena BodoLawrence Jerah Esty, NP 07/01/16 1920    Dorena BodoLawrence Jelissa Espiritu, NP 07/01/16 2033

## 2016-07-01 NOTE — Discharge Instructions (Signed)
Go to the ER for evaluation of acute abdominal pain, do not eat or drink anything till evaluated by the ER provider.

## 2016-07-04 ENCOUNTER — Encounter: Payer: Self-pay | Admitting: Physician Assistant

## 2016-07-19 ENCOUNTER — Ambulatory Visit: Payer: BLUE CROSS/BLUE SHIELD | Admitting: Physician Assistant

## 2016-08-03 ENCOUNTER — Ambulatory Visit: Payer: BLUE CROSS/BLUE SHIELD | Admitting: Physician Assistant

## 2016-08-09 ENCOUNTER — Ambulatory Visit: Payer: BLUE CROSS/BLUE SHIELD | Admitting: Internal Medicine

## 2018-04-27 ENCOUNTER — Ambulatory Visit (HOSPITAL_COMMUNITY)
Admission: EM | Admit: 2018-04-27 | Discharge: 2018-04-27 | Disposition: A | Discharge status: Home / Self Care | Payer: Self-pay | Attending: Family Medicine | Admitting: Family Medicine

## 2018-04-27 ENCOUNTER — Encounter (HOSPITAL_COMMUNITY): Payer: Self-pay | Admitting: Emergency Medicine

## 2018-04-27 DIAGNOSIS — N390 Urinary tract infection, site not specified: Secondary | ICD-10-CM | POA: Insufficient documentation

## 2018-04-27 DIAGNOSIS — Z79899 Other long term (current) drug therapy: Secondary | ICD-10-CM | POA: Insufficient documentation

## 2018-04-27 DIAGNOSIS — J45909 Unspecified asthma, uncomplicated: Secondary | ICD-10-CM | POA: Insufficient documentation

## 2018-04-27 DIAGNOSIS — F17210 Nicotine dependence, cigarettes, uncomplicated: Secondary | ICD-10-CM | POA: Insufficient documentation

## 2018-04-27 LAB — POCT PREGNANCY, URINE: PREG TEST UR: NEGATIVE

## 2018-04-27 MED ORDER — NITROFURANTOIN MONOHYD MACRO 100 MG PO CAPS: 100.0000 mg | ORAL_CAPSULE | Freq: Two times a day (BID) | ORAL | 0 refills | Status: DC

## 2018-04-27 MED ORDER — CIPROFLOXACIN HCL 500 MG PO TABS
ORAL_TABLET | ORAL | Status: AC
  Filled 2018-04-27: qty 1

## 2018-04-27 MED ORDER — CIPROFLOXACIN HCL 500 MG PO TABS
500.0000 mg | ORAL_TABLET | Freq: Once | ORAL | Status: AC
Start: 2018-04-27 — End: 2018-04-27
  Administered 2018-04-27: 500 mg via ORAL

## 2018-04-27 MED ORDER — FLUCONAZOLE 150 MG PO TABS: 150.0000 mg | ORAL_TABLET | Freq: Once | ORAL | 0 refills | Status: AC

## 2018-04-27 MED ORDER — PHENAZOPYRIDINE HCL 200 MG PO TABS: 200.0000 mg | ORAL_TABLET | Freq: Three times a day (TID) | ORAL | 0 refills | Status: DC | PRN

## 2018-04-27 NOTE — ED Provider Notes (Signed)
[facility]    CSN: [id number] Arrival date & time: 04/27/18  1515     History   Chief Complaint Chief Complaint  Patient presents with  . Dysuria    HPI Kristen Zimmerman is a 42 y.o. female.   This is an 42 year old woman who presents with 5 days of dysuria.  Patient does not have fever, flank pain, nausea, vomiting.  Past, patient has had urinary infections complicated by yeast infection after antibiotics were started.  Patient is a Production designer, theatre/television/film at [employer]     Past Medical History:  Diagnosis Date  . Asthma    childhood now resolved    There are no active problems to display for this patient.   History reviewed. No pertinent surgical history.  OB History   None      Home Medications    Prior to Admission medications   Medication Sig Start Date End Date Taking? Authorizing Provider  albuterol (PROVENTIL HFA;VENTOLIN HFA) 108 (90 Base) MCG/ACT inhaler Inhale 2 puffs into the lungs every 4 (four) hours as needed for wheezing or shortness of breath. 03/27/16   [name], NP  fluconazole (DIFLUCAN) 150 MG tablet Take 1 tablet (150 mg total) by mouth once for 1 dose. Repeat if needed 04/27/18 04/27/18  Elvina Sidle, MD  ibuprofen (ADVIL,MOTRIN) 400 MG tablet Take 400 mg by mouth every 6 (six) hours as needed.    [provider]  nitrofurantoin, macrocrystal-monohydrate, (MACROBID) 100 MG capsule Take 1 capsule (100 mg total) by mouth 2 (two) times daily. 04/27/18   Elvina Sidle, MD  phenazopyridine (PYRIDIUM) 200 MG tablet Take 1 tablet (200 mg total) by mouth 3 (three) times daily as needed for pain. 04/27/18   Elvina Sidle, MD  sodium-potassium bicarbonate (ALKA-SELTZER GOLD) TBEF dissolvable tablet Take 1 tablet by mouth daily as needed.    [provider]    Family History No family history on file.  Social History Social History   Tobacco Use  . Smoking status: Current Every Day Smoker    Types:  Cigarettes  . Smokeless tobacco: Never Used  Substance Use Topics  . Alcohol use: Yes  . Drug use: Yes    Types: Marijuana     Allergies   Patient has no known allergies.   Review of Systems Review of Systems  Genitourinary: Positive for dysuria, frequency and urgency.  All other systems reviewed and are negative.    Physical Exam Triage Vital Signs ED Triage Vitals  Enc Vitals Group     BP 04/27/18 1633 (!) 144/105     Pulse Rate 04/27/18 1633 98     Resp 04/27/18 1633 16     Temp 04/27/18 1633 97.9 F (36.6 C)     Temp Source 04/27/18 1633 Oral     SpO2 04/27/18 1633 99 %     Weight --      Height --      Head Circumference --      Peak Flow --      Pain Score 04/27/18 1635 0     Pain Loc --      Pain Edu? --      Excl. in GC? --    No data found.  Updated Vital Signs BP (!) 144/105 (BP Location: Left Arm)   Pulse 98   Temp 97.9 F (36.6 C) (Oral)   Resp 16   LMP 03/29/2018   SpO2 99%   Physical Exam  Constitutional: She appears well-developed and  well-nourished.  HENT:  Right Ear: External ear normal.  Left Ear: External ear normal.  Mouth/Throat: Oropharynx is clear and moist.  Eyes: Conjunctivae are normal.  Neck: Normal range of motion. Neck supple.  Pulmonary/Chest: Effort normal.  Abdominal: Soft. Bowel sounds are normal. There is no tenderness.  Musculoskeletal: Normal range of motion.  Neurological: She is alert.  Skin: Skin is warm and dry.  Nursing note and vitals reviewed.    UC Treatments / Results  Labs (all labs ordered are listed, but only abnormal results are displayed) Labs Reviewed  URINE CULTURE  POCT PREGNANCY, URINE  URINE CYTOLOGY ANCILLARY ONLY      Medications Ordered in UC Medications  ciprofloxacin (CIPRO) tablet 500 mg (has no administration in time range)    Initial Impression / Assessment and Plan / UC Course  I have reviewed the triage vital signs and the nursing notes.  Pertinent labs & imaging  results that were available during my care of the patient were reviewed by me and considered in my medical decision making (see chart for details).     Final Clinical Impressions(s) / UC Diagnoses   Final diagnoses:  Lower urinary tract infectious disease   Discharge Instructions   None    ED Prescriptions    Medication Sig Dispense Auth. Provider   phenazopyridine (PYRIDIUM) 200 MG tablet Take 1 tablet (200 mg total) by mouth 3 (three) times daily as needed for pain. 10 tablet Elvina Sidle, MD   nitrofurantoin, macrocrystal-monohydrate, (MACROBID) 100 MG capsule Take 1 capsule (100 mg total) by mouth 2 (two) times daily. 20 capsule Elvina Sidle, MD   fluconazole (DIFLUCAN) 150 MG tablet Take 1 tablet (150 mg total) by mouth once for 1 dose. Repeat if needed 2 tablet Elvina Sidle, MD     Controlled Substance Prescriptions [place] Controlled Substance Registry consulted? Not Applicable   Elvina Sidle, MD 04/27/18 1651

## 2018-04-27 NOTE — ED Triage Notes (Signed)
Pt states since Monday she has burning with urination. Pt taking azo.

## 2018-04-27 NOTE — Discharge Instructions (Signed)
We are running a urine culture and if there is need to change her medication, we will call you in the next 2 days.

## 2018-04-29 LAB — URINE CULTURE: Culture: >=100000 — AB

## 2018-04-30 LAB — URINE CYTOLOGY ANCILLARY ONLY
Chlamydia: NEGATIVE
Neisseria Gonorrhea: NEGATIVE
Trichomonas: POSITIVE — AB

## 2018-05-02 ENCOUNTER — Telehealth (HOSPITAL_COMMUNITY): Payer: Self-pay

## 2018-05-02 MED ORDER — METRONIDAZOLE 500 MG PO TABS: 500.0000 mg | ORAL_TABLET | Freq: Two times a day (BID) | ORAL | 0 refills | Status: DC

## 2018-05-02 NOTE — Telephone Encounter (Signed)
Trichomonas is positive. Rx metronidazole 500mg  bid x 7d #14 no refills was sent to the pharmacy of record. Need to educate patient to refrain from sexual intercourse for 7 days to give the medicine time to work. Sexual partners need to be notified and tested/treated. Condoms may reduce risk of reinfection. Attempted to reach patient. No answer at this time. Voicemail left.

## 2018-09-12 ENCOUNTER — Emergency Department (HOSPITAL_COMMUNITY)
Admission: EM | Admit: 2018-09-12 | Disposition: A | Discharge status: Home / Self Care | Payer: Self-pay | Source: Home / Self Care

## 2018-09-12 ENCOUNTER — Emergency Department (HOSPITAL_COMMUNITY)
Admission: EM | Admit: 2018-09-12 | Discharge: 2018-09-13 | Disposition: A | Discharge status: Home / Self Care | Payer: Self-pay | Attending: Emergency Medicine | Admitting: Emergency Medicine

## 2018-09-12 ENCOUNTER — Other Ambulatory Visit: Payer: Self-pay

## 2018-09-12 ENCOUNTER — Emergency Department (HOSPITAL_COMMUNITY): Payer: Self-pay

## 2018-09-12 DIAGNOSIS — B9789 Other viral agents as the cause of diseases classified elsewhere: Secondary | ICD-10-CM

## 2018-09-12 DIAGNOSIS — R0789 Other chest pain: Secondary | ICD-10-CM | POA: Insufficient documentation

## 2018-09-12 DIAGNOSIS — F17210 Nicotine dependence, cigarettes, uncomplicated: Secondary | ICD-10-CM | POA: Insufficient documentation

## 2018-09-12 DIAGNOSIS — Z79899 Other long term (current) drug therapy: Secondary | ICD-10-CM | POA: Insufficient documentation

## 2018-09-12 DIAGNOSIS — J069 Acute upper respiratory infection, unspecified: Secondary | ICD-10-CM

## 2018-09-12 LAB — BASIC METABOLIC PANEL
Anion gap: 11 (ref 5–15)
BUN: 13 mg/dL (ref 6–20)
CO2: 19 mmol/L — ABNORMAL LOW (ref 22–32)
Calcium: 8.8 mg/dL — ABNORMAL LOW (ref 8.9–10.3)
Chloride: 113 mmol/L — ABNORMAL HIGH (ref 98–111)
Creatinine, Ser: 0.77 mg/dL (ref 0.44–1.00)
GFR calc Af Amer: >60 mL/min (ref >60)
GFR calc non Af Amer: >60 mL/min (ref >60)
Glucose, Bld: 87 mg/dL (ref 70–99)
Potassium: 3.8 mmol/L (ref 3.5–5.1)
Sodium: 143 mmol/L (ref 135–145)

## 2018-09-12 LAB — CBC
HCT: 39.3 % (ref 36.0–46.0)
Hemoglobin: 12.5 g/dL (ref 12.0–15.0)
MCH: 28.9 pg (ref 26.0–34.0)
MCHC: 31.8 g/dL (ref 30.0–36.0)
MCV: 90.8 fL (ref 80.0–100.0)
NRBC: 0 % (ref 0.0–0.2)
Platelets: 298 10*3/uL (ref 150–400)
RBC: 4.33 MIL/uL (ref 3.87–5.11)
RDW: 15 % (ref 11.5–15.5)
WBC: 8.9 10*3/uL (ref 4.0–10.5)

## 2018-09-12 LAB — I-STAT BETA HCG BLOOD, ED (MC, WL, AP ONLY): I-stat hCG, quantitative: <5 m[IU]/mL (ref <5)

## 2018-09-12 LAB — I-STAT TROPONIN, ED: Troponin i, poc: 0 ng/mL (ref 0.00–0.08)

## 2018-09-12 LAB — CBG MONITORING, ED: GLUCOSE-CAPILLARY: 90 mg/dL (ref 70–99)

## 2018-09-12 MED ORDER — SODIUM CHLORIDE 0.9% FLUSH
3.0000 mL | Freq: Once | INTRAVENOUS | Status: AC
  Administered 2018-09-13: 3 mL via INTRAVENOUS

## 2018-09-12 MED ORDER — ACETAMINOPHEN 325 MG PO TABS
650.0000 mg | ORAL_TABLET | Freq: Once | ORAL | Status: AC
  Administered 2018-09-12: 650 mg via ORAL
  Filled 2018-09-12: qty 2

## 2018-09-12 MED ORDER — SODIUM CHLORIDE 0.9% FLUSH: 3.0000 mL | Freq: Once | INTRAVENOUS | Status: DC

## 2018-09-12 MED ORDER — LORAZEPAM 0.5 MG PO TABS
0.5000 mg | ORAL_TABLET | Freq: Once | ORAL | Status: AC
  Administered 2018-09-12: 0.5 mg via ORAL
  Filled 2018-09-12: qty 1

## 2018-09-12 NOTE — ED Notes (Signed)
Pt called this RN a Research officer, political party.  Notified that she could not be verbally abusive to staff and pt apologized.

## 2018-09-12 NOTE — ED Provider Notes (Signed)
43 year old female received at sign out from [place] pending delta troponin. Per her HPI:   "43 year old female with history of childhood asthma presenting for evaluation of chest pain.  Patient reports since yesterday she has had intermittent pain in her chest.  She describes a sharp shooting sensation lasting for less than a minute with some shortness of breath and tightness of the chest.  She also complains of tingling numbness sensation to her left palm that has been persistent.  Pain in her chest this is resolved.  She does endorse a cough for "a while" which was nonproductive.  She endorsed generalized malaise.  She denies having fever, chills, nausea, vomiting, abdominal pain.  Or rash.  She is a smoker.  She admits to increased lifting at her job.  She reports history of sarcoidosis.  Denies any other significant cardiac history.  No specific treatment tried.  Currently also complaining of diffuse headache."   Physical Exam  BP (!) 141/96   Pulse 85   Temp 98.2 F (36.8 C) (Oral)   Resp (!) 27   Ht 5\' 6"  (1.676 m)   Wt 77.1 kg   LMP 08/21/2018   SpO2 100%   BMI 27.43 kg/m   Physical Exam  NAD. Sounds congested.  Coughing with deep inspiration.  ED Course/Procedures     Procedures  MDM   43 year old female received a signout from [facility] [place] pending delta troponin.  Delta troponin is negative.  Notified by nursing staff that the patient was retching after receiving Tylenol for pain.  Zofran given.  She has since tolerated crackers and fluids without vomiting.  Patient continues to endorse chest pain.  She characterizes the pain as tight.  She is a current 0.5 to 1 ppd smoker.  Suspect she may have a component of bronchitis.  DuoNeb given.  On reevaluation, the patient reports market improvement in her symptoms after a nebulizer treatment.  She was also endorsing body aches and was swabbed for influenza, which was negative.  I suspect that patient has a viral URI that is contributing  to her symptoms.  Will discharge with symptomatic treatment.  She is hemodynamically stable and in no acute distress.  Return precautions to the ER given.  She is safe for discharge to home with outpatient follow-up at this time.       Frederik Pear A, PA-C 09/13/18 0501    Glynn Octave, MD 09/13/18 [phone]

## 2018-09-12 NOTE — ED Notes (Addendum)
Pt found on floor in upright position w/ Pt screaming "somebody help me," RN contacted staff for Pt assistance. Per witness, Pt slide out of w/c onto floor in slow motion

## 2018-09-12 NOTE — ED Triage Notes (Signed)
Pt c/o difficulty breathing with chest pain & Lt side of her body felt numb, her neighbor came over & called EMS.

## 2018-09-12 NOTE — ED Notes (Signed)
Per Pt to security, she did not fall out of w/c

## 2018-09-12 NOTE — ED Provider Notes (Signed)
[facility] EMERGENCY DEPARTMENT Provider Note   CSN: [id number] Arrival date & time: 09/12/18  1554    History   Chief Complaint Chief Complaint  Patient presents with  . Chest Pain    HPI AUSET FIERST is a 43 y.o. female.     The history is provided by the patient. No language interpreter was used.  Chest Pain     43 year old female with history of childhood asthma presenting for evaluation of chest pain.  Patient reports since yesterday she has had intermittent pain in her chest.  She describes a sharp shooting sensation lasting for less than a minute with some shortness of breath and tightness of the chest.  She also complains of tingling numbness sensation to her left palm that has been persistent.  Pain in her chest this is resolved.  She does endorse a cough for "a while" which was nonproductive.  She endorsed generalized malaise.  She denies having fever, chills, nausea, vomiting, abdominal pain.  Or rash.  She is a smoker.  She admits to increased lifting at her job.  She reports history of sarcoidosis.  Denies any other significant cardiac history.  No specific treatment tried.  Currently also complaining of diffuse headache.     Past Medical History:  Diagnosis Date  . Asthma    childhood now resolved    There are no active problems to display for this patient.   No past surgical history on file.   OB History   No obstetric history on file.      Home Medications    Prior to Admission medications   Medication Sig Start Date End Date Taking? Authorizing Provider  albuterol (PROVENTIL HFA;VENTOLIN HFA) 108 (90 Base) MCG/ACT inhaler Inhale 2 puffs into the lungs every 4 (four) hours as needed for wheezing or shortness of breath. 03/27/16   [name], NP  ibuprofen (ADVIL,MOTRIN) 400 MG tablet Take 400 mg by mouth every 6 (six) hours as needed.    [provider]  metroNIDAZOLE (FLAGYL) 500 MG tablet Take 1 tablet (500 mg  total) by mouth 2 (two) times daily. 05/02/18   Eustace Moore, MD  nitrofurantoin, macrocrystal-monohydrate, (MACROBID) 100 MG capsule Take 1 capsule (100 mg total) by mouth 2 (two) times daily. 04/27/18   Elvina Sidle, MD  phenazopyridine (PYRIDIUM) 200 MG tablet Take 1 tablet (200 mg total) by mouth 3 (three) times daily as needed for pain. 04/27/18   Elvina Sidle, MD  sodium-potassium bicarbonate (ALKA-SELTZER GOLD) TBEF dissolvable tablet Take 1 tablet by mouth daily as needed.    [provider]    Family History No family history on file.  Social History Social History   Tobacco Use  . Smoking status: Current Every Day Smoker    Types: Cigarettes  . Smokeless tobacco: Never Used  Substance Use Topics  . Alcohol use: Yes  . Drug use: Yes    Types: Marijuana     Allergies   Patient has no known allergies.   Review of Systems Review of Systems  Cardiovascular: Positive for chest pain.  All other systems reviewed and are negative.    Physical Exam Updated Vital Signs BP (!) 140/94   Pulse 86   Temp 98.3 F (36.8 C) (Oral)   Resp 10   Ht 5\' 6"  (1.676 m)   Wt 77.1 kg   LMP 08/21/2018   SpO2 98%   BMI 27.43 kg/m   Physical Exam Vitals signs and  nursing note reviewed.  Constitutional:      General: She is not in acute distress.    Appearance: She is well-developed.  HENT:     Head: Atraumatic.  Eyes:     Conjunctiva/sclera: Conjunctivae normal.  Neck:     Musculoskeletal: Normal range of motion and neck supple.     Comments: No cervical spine tenderness crepitus or step-off Cardiovascular:     Rate and Rhythm: Normal rate and regular rhythm.  Pulmonary:     Effort: Pulmonary effort is normal.     Breath sounds: Normal breath sounds.  Chest:     Chest wall: Tenderness (Diffuse tenderness throughout anterior and posterior chest wall on palpation without focal point tenderness, crepitus, emphysema or rash.) present.  Abdominal:      General: Abdomen is flat.     Palpations: Abdomen is soft.     Tenderness: There is no abdominal tenderness.  Musculoskeletal:        General: Tenderness (Tenderness throughout left arm without focal point tenderness or joint pain.  Radial pulse 2+, normal grip strength.) present.  Skin:    General: Skin is warm.     Capillary Refill: Capillary refill takes less than 2 seconds.     Findings: No rash.  Neurological:     Mental Status: She is alert and oriented to person, place, and time.  Psychiatric:        Mood and Affect: Mood normal.      ED Treatments / Results  Labs (all labs ordered are listed, but only abnormal results are displayed) Labs Reviewed  BASIC METABOLIC PANEL - Abnormal; Notable for the following components:      Result Value   Chloride 113 (*)    CO2 19 (*)    Calcium 8.8 (*)    All other components within normal limits  CBC  CBG MONITORING, ED  I-STAT TROPONIN, ED  I-STAT BETA HCG BLOOD, ED ([facility], [facility], [facility] ONLY)  I-STAT TROPONIN, ED    EKG EKG Interpretation  Date/Time:  Wednesday September 12 2018 16:01:51 EST Ventricular Rate:  93 PR Interval:  168 QRS Duration: 86 QT Interval:  366 QTC Calculation: 455 R Axis:   63 Text Interpretation:  Normal sinus rhythm Nonspecific ST abnormality No significant change since last tracing Confirmed by Cathren Laine ([id number]) on 09/12/2018 4:05:05 PM   Radiology Dg Chest 2 View  Result Date: 09/12/2018 CLINICAL DATA:  Chest pain EXAM: CHEST - 2 VIEW COMPARISON:  April 01, 2016 FINDINGS: No edema or consolidation. The heart size and pulmonary vascularity are normal. No adenopathy. No pneumothorax. No bone lesions. IMPRESSION: No edema or consolidation. Electronically Signed   By: Bretta Bang III M.D.   On: 09/12/2018 17:31    Procedures Procedures (including critical care time)  Medications Ordered in ED Medications  sodium chloride flush (NS) 0.9 % injection 3 mL (has no administration in time range)    acetaminophen (TYLENOL) tablet 650 mg (has no administration in time range)  LORazepam (ATIVAN) tablet 0.5 mg (0.5 mg Oral Given 09/12/18 1620)     Initial Impression / Assessment and Plan / ED Course  I have reviewed the triage vital signs and the nursing notes.  Pertinent labs & imaging results that were available during my care of the patient were reviewed by me and considered in my medical decision making (see chart for details).        BP (!) 140/94   Pulse 86   Temp 98.3 F (36.8  C) (Oral)   Resp 10   Ht  (1.676 m)   Wt 77.1 kg   LMP 08/21/2018   SpO2 98%   BMI 27.43 kg/m    Final Clinical Impressions(s) / ED Diagnoses   Final diagnoses:  Atypical chest pain    ED Discharge Orders    None     2:56 PM A 43 year old patient presents for evaluation of chest pain. Initial onset of pain was less than one hour in duration. The patient's chest pain is not worse with exertion. The patient's chest pain is middle- or left-sided, is not well-localized, is not described as heaviness/pressure/tightness, is not sharp and does radiate to the arms/jaw/neck. The patient does not complain of nausea and denies diaphoresis. The patient has smoked in the past 90 days. The patient has no history of stroke, has no history of peripheral artery disease, denies any history of treated diabetes, has no relevant family history of coronary artery disease (first degree relative at less than age 90), is not hypertensive, has no history of hypercholesterolemia and does not have an elevated BMI (>=30).   10:38 PM Heart Pathway score of 3, low risk of MACE, will obtain delta trop.  PERC negative, doubt PE.   Endorse headache, no red flags. Tylenol given  11:31 PM Work up unremarkable.  Doubt ACS or PE.  Pt mentioned dad has sarcoidosis.  Her CXR today unremarkable.  Reassurance given.  Currently no acute emergent medical condition identified.  Pt d/c home with recommendation to f/u with PCP  for further care.  Suspect aspect of MSK pain causing pt's discomfort.   11:51 PM Pt sign out to oncoming provider who will f/u on delta trop.  Anticipate negative delta trop and pt can f/u outpt.  May discharge with muscle relaxant and NSAIDs for MSK pain.     Fayrene Helper, PA-C 09/12/18 2354    Vanetta Mulders, MD 09/13/18 [phone]

## 2018-09-13 LAB — INFLUENZA PANEL BY PCR (TYPE A & B)
Influenza A By PCR: NEGATIVE
Influenza B By PCR: NEGATIVE

## 2018-09-13 LAB — CBG MONITORING, ED: Glucose-Capillary: 89 mg/dL (ref 70–99)

## 2018-09-13 LAB — I-STAT TROPONIN, ED: Troponin i, poc: 0 ng/mL (ref 0.00–0.08)

## 2018-09-13 MED ORDER — ONDANSETRON 4 MG PO TBDP: 4.0000 mg | ORAL_TABLET | Freq: Three times a day (TID) | ORAL | 0 refills | Status: AC | PRN

## 2018-09-13 MED ORDER — IPRATROPIUM BROMIDE 0.02 % IN SOLN
0.5000 mg | Freq: Once | RESPIRATORY_TRACT | Status: AC
  Administered 2018-09-13: 0.5 mg via RESPIRATORY_TRACT
  Filled 2018-09-13: qty 2.5

## 2018-09-13 MED ORDER — PROMETHAZINE-DM 6.25-15 MG/5ML PO SYRP: 5.0000 mL | ORAL_SOLUTION | Freq: Four times a day (QID) | ORAL | 0 refills | Status: DC | PRN

## 2018-09-13 MED ORDER — ALBUTEROL SULFATE HFA 108 (90 BASE) MCG/ACT IN AERS
2.0000 | INHALATION_SPRAY | RESPIRATORY_TRACT | Status: DC
  Administered 2018-09-13: 2 via RESPIRATORY_TRACT
  Filled 2018-09-13: qty 6.7

## 2018-09-13 MED ORDER — ONDANSETRON HCL 4 MG/2ML IJ SOLN
4.0000 mg | Freq: Once | INTRAMUSCULAR | Status: AC
  Administered 2018-09-13: 4 mg via INTRAVENOUS
  Filled 2018-09-13: qty 2

## 2018-09-13 MED ORDER — SODIUM CHLORIDE 0.9 % IV BOLUS
1000.0000 mL | Freq: Once | INTRAVENOUS | Status: AC
  Administered 2018-09-13: 1000 mL via INTRAVENOUS

## 2018-09-13 MED ORDER — ALBUTEROL SULFATE (2.5 MG/3ML) 0.083% IN NEBU
5.0000 mg | INHALATION_SOLUTION | Freq: Once | RESPIRATORY_TRACT | Status: AC
  Administered 2018-09-13: 5 mg via RESPIRATORY_TRACT
  Filled 2018-09-13: qty 6

## 2018-09-13 MED ORDER — GUAIFENESIN 100 MG/5ML PO SOLN
5.0000 mL | Freq: Once | ORAL | Status: AC
  Administered 2018-09-13: 100 mg via ORAL
  Filled 2018-09-13: qty 5

## 2018-09-13 NOTE — ED Notes (Signed)
Patient verbalizes understanding of discharge instructions. Opportunity for questioning and answers were provided. Armband removed by staff, pt discharged from ED by wheelchair   

## 2018-09-13 NOTE — Discharge Instructions (Addendum)
Thank you for allowing me to care for you today in the Emergency Department.   Use 2 puffs of your albuterol inhaler with a spacer every 4 hours as needed for chest tightness, cough, or wheezing.  I would encourage you to stop smoking as this may be contributing to your symptoms.  Use 5 mL's of Promethazine DM every 6 hours as needed for nasal congestion or cough.  Take 650 mg of Tylenol or 6 mg of ibuprofen with food every 6 hours as needed for pain or fever.  Return to the emergency department if you develop persistent vomiting despite taking Zofran, respiratory distress, high fever, or other new, concerning symptoms.

## 2018-09-13 NOTE — ED Notes (Signed)
Pt refused esig upon discharge. Family was agitated about wait time to leave

## 2021-02-08 ENCOUNTER — Other Ambulatory Visit: Payer: Self-pay | Admitting: Physician Assistant

## 2021-02-08 DIAGNOSIS — Z1231 Encounter for screening mammogram for malignant neoplasm of breast: Secondary | ICD-10-CM

## 2021-02-11 ENCOUNTER — Other Ambulatory Visit: Payer: Self-pay | Admitting: Sports Medicine

## 2021-02-11 ENCOUNTER — Encounter: Payer: Self-pay | Admitting: Sports Medicine

## 2021-02-11 ENCOUNTER — Ambulatory Visit (INDEPENDENT_AMBULATORY_CARE_PROVIDER_SITE_OTHER): Payer: 59

## 2021-02-11 ENCOUNTER — Other Ambulatory Visit: Payer: Self-pay

## 2021-02-11 ENCOUNTER — Ambulatory Visit (INDEPENDENT_AMBULATORY_CARE_PROVIDER_SITE_OTHER): Payer: 59 | Admitting: Sports Medicine

## 2021-02-11 DIAGNOSIS — M792 Neuralgia and neuritis, unspecified: Secondary | ICD-10-CM

## 2021-02-11 DIAGNOSIS — S92501A Displaced unspecified fracture of right lesser toe(s), initial encounter for closed fracture: Secondary | ICD-10-CM

## 2021-02-11 DIAGNOSIS — M79671 Pain in right foot: Secondary | ICD-10-CM

## 2021-02-11 NOTE — Progress Notes (Signed)
Subjective: Kristen Zimmerman is a 45 y.o. female patient who presents to office for evaluation of Right foot pain. Patient complains of progressive pain especially over the last 3 weeks and states that she kept bumping and hitting her foot and states that she has been experiencing sharp shooting pain and a lot of swelling over the fifth toe states that she had it x-rayed a couple of weeks ago and they said nothing was wrong. Patient denies any other pedal complaints.   There are no problems to display for this patient.   Current Outpatient Medications on File Prior to Visit  Medication Sig Dispense Refill   albuterol (PROVENTIL HFA;VENTOLIN HFA) 108 (90 Base) MCG/ACT inhaler Inhale 2 puffs into the lungs every 4 (four) hours as needed for wheezing or shortness of breath. 1 Inhaler 0   gabapentin (NEURONTIN) 100 MG capsule Take 100-200 mg by mouth at bedtime.     hydrochlorothiazide (HYDRODIURIL) 12.5 MG tablet Take 12.5 mg by mouth daily.     ondansetron (ZOFRAN ODT) 4 MG disintegrating tablet Take 1 tablet (4 mg total) by mouth every 8 (eight) hours as needed. 20 tablet 0   predniSONE (DELTASONE) 20 MG tablet Take 20 mg by mouth daily.     promethazine-dextromethorphan (PROMETHAZINE-DM) 6.25-15 MG/5ML syrup Take 5 mLs by mouth 4 (four) times daily as needed for cough. 118 mL 0   No current facility-administered medications on file prior to visit.    No Known Allergies  Objective:  General: Alert and oriented x3 in no acute distress  Dermatology: No open lesions bilateral lower extremities, no webspace macerations, no ecchymosis bilateral, all nails x 10 are short and thick.  Vascular: Focal edema noted to right/left foot. Dorsalis Pedis and Posterior Tibial pedal pulses 2/4, Capillary Fill Time 3 seconds,(+) pedal hair growth bilateral, Temperature gradient within normal limits.  Neurology: Gross sensation intact via light touch bilateral, sharp shooting pains to toes on right  foot  Musculoskeletal: There is tenderness with palpation at right fifth toe, all joint range of motion is within normal limits with guarding on the right due to pain, Strength within normal limits in all groups bilateral.   Gait: Unassisted, Antalgic gait in flip-flops  Xrays  Right foot impression Minimally displaced fracture of the proximal phalanx of the fifth toe mild soft tissue swelling no other fractures identified  Assessment and Plan: Problem List Items Addressed This Visit   None Visit Diagnoses     Closed fracture of phalanx of right fifth toe, initial encounter    -  Primary   Pain in right foot       Neuritis            -Complete examination performed -Xrays reviewed -Discussed treatement options for fracture; risks, alternatives, and benefits explained. -Dispensed CAM walker to patient to wear at all times and instructed on use -Recommend protection, rest, ice, elevation daily until symptoms improve -Advised patient to try over-the-counter Nervive nerve supplement or to take her gabapentin as prescribed by her PCP for neuritis -Handicap sticker provided temporary for 6 months -Patient to return to office in 4 weeks for serial x-rays to assess healing  or sooner if condition worsens.  Asencion Islam, DPM

## 2021-02-11 NOTE — Patient Instructions (Addendum)
Nervive nerve relief supplement for your nerves can be purchased OTC at [facility]  

## 2021-02-26 ENCOUNTER — Other Ambulatory Visit: Payer: Self-pay

## 2021-02-26 ENCOUNTER — Ambulatory Visit
Admission: RE | Admit: 2021-02-26 | Discharge: 2021-02-26 | Disposition: A | Discharge status: Home / Self Care | Payer: 59 | Source: Ambulatory Visit | Attending: Physician Assistant | Admitting: Physician Assistant

## 2021-02-26 DIAGNOSIS — Z1231 Encounter for screening mammogram for malignant neoplasm of breast: Secondary | ICD-10-CM

## 2021-03-03 ENCOUNTER — Other Ambulatory Visit: Payer: Self-pay | Admitting: Physician Assistant

## 2021-03-03 DIAGNOSIS — R928 Other abnormal and inconclusive findings on diagnostic imaging of breast: Secondary | ICD-10-CM

## 2021-03-17 ENCOUNTER — Ambulatory Visit
Admission: RE | Admit: 2021-03-17 | Discharge: 2021-03-17 | Disposition: A | Discharge status: Home / Self Care | Payer: 59 | Source: Ambulatory Visit | Attending: Physician Assistant | Admitting: Physician Assistant

## 2021-03-17 ENCOUNTER — Other Ambulatory Visit: Payer: Self-pay | Admitting: Physician Assistant

## 2021-03-17 ENCOUNTER — Other Ambulatory Visit: Payer: Self-pay

## 2021-03-17 DIAGNOSIS — R928 Other abnormal and inconclusive findings on diagnostic imaging of breast: Secondary | ICD-10-CM

## 2021-03-18 ENCOUNTER — Other Ambulatory Visit: Payer: Self-pay | Admitting: Sports Medicine

## 2021-03-18 ENCOUNTER — Ambulatory Visit (INDEPENDENT_AMBULATORY_CARE_PROVIDER_SITE_OTHER): Payer: 59

## 2021-03-18 ENCOUNTER — Encounter: Payer: Self-pay | Admitting: Sports Medicine

## 2021-03-18 ENCOUNTER — Ambulatory Visit (INDEPENDENT_AMBULATORY_CARE_PROVIDER_SITE_OTHER): Payer: 59 | Admitting: Sports Medicine

## 2021-03-18 DIAGNOSIS — M79671 Pain in right foot: Secondary | ICD-10-CM

## 2021-03-18 DIAGNOSIS — S92501A Displaced unspecified fracture of right lesser toe(s), initial encounter for closed fracture: Secondary | ICD-10-CM

## 2021-03-18 DIAGNOSIS — S92501D Displaced unspecified fracture of right lesser toe(s), subsequent encounter for fracture with routine healing: Secondary | ICD-10-CM

## 2021-03-18 NOTE — Progress Notes (Signed)
Subjective: Kristen Zimmerman is a 45 y.o. female patient who presents to office for follow up evaluation of Right foot pain. Patient reports that her toe feels better and that meds help. Can not wear boot because it was heavy and rubbing the back of her leg at work. Patient denies any other pedal complaints.   There are no problems to display for this patient.   Current Outpatient Medications on File Prior to Visit  Medication Sig Dispense Refill   albuterol (PROVENTIL HFA;VENTOLIN HFA) 108 (90 Base) MCG/ACT inhaler Inhale 2 puffs into the lungs every 4 (four) hours as needed for wheezing or shortness of breath. 1 Inhaler 0   gabapentin (NEURONTIN) 100 MG capsule Take 100-200 mg by mouth at bedtime.     hydrochlorothiazide (HYDRODIURIL) 12.5 MG tablet Take 12.5 mg by mouth daily.     ondansetron (ZOFRAN ODT) 4 MG disintegrating tablet Take 1 tablet (4 mg total) by mouth every 8 (eight) hours as needed. 20 tablet 0   predniSONE (DELTASONE) 20 MG tablet Take 20 mg by mouth daily.     promethazine-dextromethorphan (PROMETHAZINE-DM) 6.25-15 MG/5ML syrup Take 5 mLs by mouth 4 (four) times daily as needed for cough. 118 mL 0   No current facility-administered medications on file prior to visit.    No Known Allergies  Objective:  General: Alert and oriented x3 in no acute distress  Dermatology: No open lesions bilateral lower extremities, no webspace macerations, no ecchymosis bilateral, all nails x 10 are short and thick.  Vascular: Focal edema noted to right 5th toe. Dorsalis Pedis and Posterior Tibial pedal pulses 2/4, Capillary Fill Time 3 seconds,(+) pedal hair growth bilateral, Temperature gradient within normal limits.  Neurology: Gross sensation intact via light touch bilateral, sharp shooting pains to toes on right foot  Musculoskeletal: There is minimal tenderness with palpation at right fifth toe, all joint range of motion is within normal limits with guarding on the right due to  pain, Strength within normal limits in all groups bilateral.   Xrays  Right foot impression Minimally displaced fracture of the proximal phalanx of the fifth toe  with increased ossification mild soft tissue swelling no other fractures identified  Assessment and Plan: Problem List Items Addressed This Visit   None Visit Diagnoses     Closed fracture of phalanx of right fifth toe with routine healing, subsequent encounter    -  Primary   Pain in right foot            -Complete examination performed -Xrays reviewed -Re-Discussed treatement options for fracture; risks, alternatives, and benefits explained. -Since patient could not tolerate CAM boot advised stiff sole shoe that does not rub toe -Recommend protection, rest, ice, elevation daily until symptoms improve -Continue with over-the-counter Nervive nerve supplement or to take her gabapentin as prescribed by her PCP for neuritis -Patient to return to office in 4-6weeks for serial x-rays to assess healing  or sooner if condition worsens.  Asencion Islam, DPM

## 2021-04-29 ENCOUNTER — Ambulatory Visit: Payer: 59 | Admitting: Sports Medicine

## 2021-05-20 ENCOUNTER — Ambulatory Visit (INDEPENDENT_AMBULATORY_CARE_PROVIDER_SITE_OTHER): Payer: 59 | Admitting: Sports Medicine

## 2021-05-20 ENCOUNTER — Other Ambulatory Visit: Payer: Self-pay

## 2021-05-20 ENCOUNTER — Ambulatory Visit (INDEPENDENT_AMBULATORY_CARE_PROVIDER_SITE_OTHER): Payer: 59

## 2021-05-20 ENCOUNTER — Encounter: Payer: Self-pay | Admitting: Sports Medicine

## 2021-05-20 DIAGNOSIS — S92501D Displaced unspecified fracture of right lesser toe(s), subsequent encounter for fracture with routine healing: Secondary | ICD-10-CM

## 2021-05-20 DIAGNOSIS — M79671 Pain in right foot: Secondary | ICD-10-CM

## 2021-05-20 NOTE — Progress Notes (Signed)
Subjective: Kristen Zimmerman is a 45 y.o. female patient who returns to office for follow up evaluation of Right foot pain. Patient reports that her toe feels better no other issues. Patient denies any other pedal complaints.   There are no problems to display for this patient.   Current Outpatient Medications on File Prior to Visit  Medication Sig Dispense Refill   albuterol (PROVENTIL HFA;VENTOLIN HFA) 108 (90 Base) MCG/ACT inhaler Inhale 2 puffs into the lungs every 4 (four) hours as needed for wheezing or shortness of breath. 1 Inhaler 0   gabapentin (NEURONTIN) 100 MG capsule Take 100-200 mg by mouth at bedtime.     hydrochlorothiazide (HYDRODIURIL) 12.5 MG tablet Take 12.5 mg by mouth daily.     ondansetron (ZOFRAN ODT) 4 MG disintegrating tablet Take 1 tablet (4 mg total) by mouth every 8 (eight) hours as needed. 20 tablet 0   predniSONE (DELTASONE) 20 MG tablet Take 20 mg by mouth daily.     promethazine-dextromethorphan (PROMETHAZINE-DM) 6.25-15 MG/5ML syrup Take 5 mLs by mouth 4 (four) times daily as needed for cough. 118 mL 0   No current facility-administered medications on file prior to visit.    No Known Allergies  Objective:  General: Alert and oriented x3 in no acute distress  Dermatology: No open lesions bilateral lower extremities, no webspace macerations, no ecchymosis bilateral, all nails x 10 are short and thick.  Vascular: No edema noted to right 5th toe. Dorsalis Pedis and Posterior Tibial pedal pulses 2/4, Capillary Fill Time 3 seconds,(+) pedal hair growth bilateral, Temperature gradient within normal limits.  Neurology: Gross sensation intact via light touch bilateral.  Musculoskeletal: There is no tenderness with palpation at right fifth toe, all joint range of motion is within normal limits, Strength within normal limits in all groups bilateral.   Xrays  Right foot impression Prematurely head 5th fracture   Assessment and Plan: Problem List Items  Addressed This Visit   None Visit Diagnoses     Closed fracture of phalanx of right fifth toe with routine healing, subsequent encounter    -  Primary   Relevant Orders   DG Foot Complete Right   Pain in right foot            -Complete examination performed -Xrays reviewed -Right 5th toe fracture is healed -Continue with normal shoes as tolerated -Patient to return to office PRN Asencion Islam, DPM

## 2021-09-16 ENCOUNTER — Other Ambulatory Visit: Payer: Self-pay | Admitting: Physician Assistant

## 2021-09-16 ENCOUNTER — Ambulatory Visit
Admission: RE | Admit: 2021-09-16 | Discharge: 2021-09-16 | Disposition: A | Discharge status: Home / Self Care | Payer: Managed Care, Other (non HMO) | Source: Ambulatory Visit | Attending: Physician Assistant | Admitting: Physician Assistant

## 2021-09-16 DIAGNOSIS — R928 Other abnormal and inconclusive findings on diagnostic imaging of breast: Secondary | ICD-10-CM

## 2022-03-23 ENCOUNTER — Other Ambulatory Visit: Payer: Managed Care, Other (non HMO)

## 2022-04-12 ENCOUNTER — Ambulatory Visit
Admission: RE | Admit: 2022-04-12 | Discharge: 2022-04-12 | Disposition: A | Discharge status: Home / Self Care | Payer: Commercial Managed Care - HMO | Source: Ambulatory Visit | Attending: Physician Assistant | Admitting: Physician Assistant

## 2022-04-12 ENCOUNTER — Ambulatory Visit
Admission: RE | Admit: 2022-04-12 | Discharge: 2022-04-12 | Disposition: A | Discharge status: Home / Self Care | Payer: Managed Care, Other (non HMO) | Source: Ambulatory Visit | Attending: Physician Assistant | Admitting: Physician Assistant

## 2022-04-12 DIAGNOSIS — R928 Other abnormal and inconclusive findings on diagnostic imaging of breast: Secondary | ICD-10-CM

## 2022-09-08 ENCOUNTER — Other Ambulatory Visit: Payer: Self-pay | Admitting: Physician Assistant

## 2022-09-08 ENCOUNTER — Other Ambulatory Visit: Payer: Self-pay

## 2022-09-08 ENCOUNTER — Encounter (HOSPITAL_BASED_OUTPATIENT_CLINIC_OR_DEPARTMENT_OTHER): Payer: Self-pay | Admitting: Emergency Medicine

## 2022-09-08 ENCOUNTER — Emergency Department (HOSPITAL_BASED_OUTPATIENT_CLINIC_OR_DEPARTMENT_OTHER)
Admission: EM | Admit: 2022-09-08 | Discharge: 2022-09-08 | Disposition: A | Discharge status: Home / Self Care | Payer: No Typology Code available for payment source | Attending: Emergency Medicine | Admitting: Emergency Medicine

## 2022-09-08 DIAGNOSIS — J45909 Unspecified asthma, uncomplicated: Secondary | ICD-10-CM | POA: Diagnosis not present

## 2022-09-08 DIAGNOSIS — N611 Abscess of the breast and nipple: Secondary | ICD-10-CM

## 2022-09-08 DIAGNOSIS — Z7951 Long term (current) use of inhaled steroids: Secondary | ICD-10-CM | POA: Insufficient documentation

## 2022-09-08 DIAGNOSIS — Z7952 Long term (current) use of systemic steroids: Secondary | ICD-10-CM | POA: Diagnosis not present

## 2022-09-08 DIAGNOSIS — N644 Mastodynia: Secondary | ICD-10-CM

## 2022-09-08 LAB — BASIC METABOLIC PANEL
Anion gap: 7 (ref 5–15)
BUN: 19 mg/dL (ref 6–20)
CO2: 30 mmol/L (ref 22–32)
Calcium: 9.5 mg/dL (ref 8.9–10.3)
Chloride: 104 mmol/L (ref 98–111)
Creatinine, Ser: 0.9 mg/dL (ref 0.44–1.00)
GFR, Estimated: >60 mL/min (ref >60)
Glucose, Bld: 107 mg/dL — ABNORMAL HIGH (ref 70–99)
Potassium: 3.3 mmol/L — ABNORMAL LOW (ref 3.5–5.1)
Sodium: 141 mmol/L (ref 135–145)

## 2022-09-08 LAB — CBC WITH DIFFERENTIAL/PLATELET
Abs Immature Granulocytes: 0.03 10*3/uL (ref 0.00–0.07)
Basophils Absolute: 0.1 10*3/uL (ref 0.0–0.1)
Basophils Relative: 1 %
Eosinophils Absolute: 0.4 10*3/uL (ref 0.0–0.5)
Eosinophils Relative: 4 %
HCT: 35.9 % — ABNORMAL LOW (ref 36.0–46.0)
Hemoglobin: 12.1 g/dL (ref 12.0–15.0)
Immature Granulocytes: 0 %
Lymphocytes Relative: 18 %
Lymphs Abs: 1.9 10*3/uL (ref 0.7–4.0)
MCH: 30.3 pg (ref 26.0–34.0)
MCHC: 33.7 g/dL (ref 30.0–36.0)
MCV: 89.8 fL (ref 80.0–100.0)
Monocytes Absolute: 0.9 10*3/uL (ref 0.1–1.0)
Monocytes Relative: 9 %
Neutro Abs: 7.1 10*3/uL (ref 1.7–7.7)
Neutrophils Relative %: 68 %
Platelets: 299 10*3/uL (ref 150–400)
RBC: 4 MIL/uL (ref 3.87–5.11)
RDW: 14.2 % (ref 11.5–15.5)
WBC: 10.4 10*3/uL (ref 4.0–10.5)
nRBC: 0 % (ref 0.0–0.2)

## 2022-09-08 LAB — LACTIC ACID, PLASMA: Lactic Acid, Venous: 1 mmol/L (ref 0.5–1.9)

## 2022-09-08 MED ORDER — OXYCODONE HCL 5 MG PO TABS
5.0000 mg | ORAL_TABLET | Freq: Once | ORAL | Status: AC
  Administered 2022-09-08: 5 mg via ORAL
  Filled 2022-09-08: qty 1

## 2022-09-08 MED ORDER — CEPHALEXIN 500 MG PO CAPS: 500.0000 mg | ORAL_CAPSULE | Freq: Two times a day (BID) | ORAL | 0 refills | Status: AC

## 2022-09-08 MED ORDER — SULFAMETHOXAZOLE-TRIMETHOPRIM 800-160 MG PO TABS: 1.0000 | ORAL_TABLET | Freq: Two times a day (BID) | ORAL | 0 refills | Status: AC

## 2022-09-08 MED ORDER — LIDOCAINE HCL 2 % IJ SOLN
10.0000 mL | Freq: Once | INTRAMUSCULAR | Status: AC
  Administered 2022-09-08: 200 mg
  Filled 2022-09-08: qty 20

## 2022-09-08 NOTE — Discharge Instructions (Addendum)
You were seen in the emergency department for an abscess.  We have drained the area and cleaned it. I would like you to have the wound checked in 2-3 days. This can be done by any doctor's office, urgent care, or emergency department. This is to make sure the area hasn't closed too soon. Try to keep the area as clean and dry as possible. It is okay to let warm soapy water run over the area, but do NOT scrub the area.   I am placing you on a course of antibiotics. It is important you finish the entire course! Finish your current antibiotic, and then the following day, start the one I'm prescribing for you. It will be two different antibiotics, each taken twice daily for one week.   You can take ibuprofen or tylenol as needed for pain.   You may find lots of antibiotics upset your stomach. I recommend either taking an over the counter probiotic or eating foods high in probiotics like yogurt.   Continue to monitor how you're doing and return to the ER for new or worsening symptoms.

## 2022-09-08 NOTE — ED Triage Notes (Signed)
Pt reports abscess on her right breast. Pt reports she was given antibiotics 1 week ago. Denies fevers and vomiting.

## 2022-09-08 NOTE — ED Notes (Signed)
Pt verbalized understanding of d/c instructions, meds, and followup care. Denies questions. VSS, no distress noted. Steady gait to exit with all belongings. Husband to drive home.
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
## 2022-09-30 ENCOUNTER — Ambulatory Visit
Admission: RE | Admit: 2022-09-30 | Discharge: 2022-09-30 | Disposition: A | Discharge status: Home / Self Care | Payer: No Typology Code available for payment source | Source: Ambulatory Visit | Attending: Physician Assistant | Admitting: Physician Assistant

## 2022-09-30 ENCOUNTER — Ambulatory Visit
Admission: RE | Admit: 2022-09-30 | Discharge: 2022-09-30 | Disposition: A | Discharge status: Home / Self Care | Payer: Commercial Managed Care - HMO | Source: Ambulatory Visit | Attending: Physician Assistant | Admitting: Physician Assistant

## 2022-09-30 ENCOUNTER — Other Ambulatory Visit: Payer: Self-pay | Admitting: Physician Assistant

## 2022-09-30 DIAGNOSIS — N644 Mastodynia: Secondary | ICD-10-CM

## 2022-09-30 DIAGNOSIS — Z872 Personal history of diseases of the skin and subcutaneous tissue: Secondary | ICD-10-CM

## 2022-10-20 ENCOUNTER — Ambulatory Visit
Admission: RE | Admit: 2022-10-20 | Discharge: 2022-10-20 | Disposition: A | Discharge status: Home / Self Care | Payer: No Typology Code available for payment source | Source: Ambulatory Visit | Attending: Physician Assistant | Admitting: Physician Assistant

## 2022-10-20 ENCOUNTER — Other Ambulatory Visit: Payer: Self-pay | Admitting: Physician Assistant

## 2022-10-20 DIAGNOSIS — Z872 Personal history of diseases of the skin and subcutaneous tissue: Secondary | ICD-10-CM

## 2022-12-21 ENCOUNTER — Ambulatory Visit (INDEPENDENT_AMBULATORY_CARE_PROVIDER_SITE_OTHER): Payer: No Typology Code available for payment source

## 2022-12-21 ENCOUNTER — Encounter (HOSPITAL_COMMUNITY): Payer: Self-pay

## 2022-12-21 ENCOUNTER — Ambulatory Visit (HOSPITAL_COMMUNITY)
Admission: EM | Admit: 2022-12-21 | Discharge: 2022-12-21 | Disposition: A | Discharge status: Home / Self Care | Payer: No Typology Code available for payment source

## 2022-12-21 DIAGNOSIS — S0012XA Contusion of left eyelid and periocular area, initial encounter: Secondary | ICD-10-CM

## 2022-12-21 DIAGNOSIS — G519 Disorder of facial nerve, unspecified: Secondary | ICD-10-CM
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
## 2022-12-21 NOTE — Discharge Instructions (Addendum)
The xray was normal, there is no sign of broken bone or facial fracture.  Continue ice for pain/swelling. Continue ibuprofen, you can also alterate with tylenol  Allow several more days for symptoms to improve. You may have some irritation or inflammation of a nerve in your face. The ice and medicine should help this.  Please call your primary care provider for follow up if still having symptoms Go to the emergency department if anything worsens.
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
## 2023-03-22 ENCOUNTER — Other Ambulatory Visit: Payer: Self-pay | Admitting: Physician Assistant

## 2023-03-22 DIAGNOSIS — Z872 Personal history of diseases of the skin and subcutaneous tissue: Secondary | ICD-10-CM

## 2023-04-14 ENCOUNTER — Other Ambulatory Visit: Payer: No Typology Code available for payment source

## 2023-04-20 ENCOUNTER — Other Ambulatory Visit: Payer: No Typology Code available for payment source

## 2023-04-20 ENCOUNTER — Inpatient Hospital Stay: Admission: RE | Admit: 2023-04-20 | Payer: No Typology Code available for payment source | Source: Ambulatory Visit

## 2023-05-04 ENCOUNTER — Other Ambulatory Visit: Payer: Self-pay | Admitting: Physician Assistant

## 2023-05-04 ENCOUNTER — Ambulatory Visit
Admission: RE | Admit: 2023-05-04 | Discharge: 2023-05-04 | Disposition: A | Discharge status: Home / Self Care | Payer: No Typology Code available for payment source | Source: Ambulatory Visit | Attending: Physician Assistant | Admitting: Physician Assistant

## 2023-05-04 DIAGNOSIS — Z872 Personal history of diseases of the skin and subcutaneous tissue: Secondary | ICD-10-CM

## 2023-05-04 DIAGNOSIS — N6320 Unspecified lump in the left breast, unspecified quadrant: Secondary | ICD-10-CM

## 2023-05-05 ENCOUNTER — Encounter: Payer: Self-pay | Admitting: Physician Assistant

## 2023-08-17 ENCOUNTER — Ambulatory Visit: Payer: No Typology Code available for payment source | Admitting: Obstetrics & Gynecology

## 2023-09-25 ENCOUNTER — Ambulatory Visit: Payer: No Typology Code available for payment source | Admitting: Obstetrics & Gynecology

## 2023-10-19 ENCOUNTER — Ambulatory Visit: Admitting: Obstetrics and Gynecology

## 2023-11-09 ENCOUNTER — Ambulatory Visit
Admission: RE | Admit: 2023-11-09 | Discharge: 2023-11-09 | Disposition: A | Discharge status: Home / Self Care | Payer: No Typology Code available for payment source | Source: Ambulatory Visit | Attending: Physician Assistant | Admitting: Physician Assistant

## 2023-11-09 ENCOUNTER — Other Ambulatory Visit: Payer: Self-pay | Admitting: Physician Assistant

## 2023-11-09 DIAGNOSIS — Z872 Personal history of diseases of the skin and subcutaneous tissue: Secondary | ICD-10-CM

## 2023-11-09 DIAGNOSIS — N6310 Unspecified lump in the right breast, unspecified quadrant: Secondary | ICD-10-CM

## 2023-12-05 ENCOUNTER — Ambulatory Visit
Admission: EM | Admit: 2023-12-05 | Discharge: 2023-12-05 | Disposition: A | Discharge status: Home / Self Care | Attending: Family Medicine | Admitting: Family Medicine

## 2023-12-05 DIAGNOSIS — L03031 Cellulitis of right toe: Secondary | ICD-10-CM | POA: Diagnosis not present

## 2023-12-05 MED ORDER — KETOROLAC TROMETHAMINE 30 MG/ML IJ SOLN
30.0000 mg | Freq: Once | INTRAMUSCULAR | Status: AC
  Administered 2023-12-05: 30 mg via INTRAMUSCULAR

## 2023-12-05 MED ORDER — KETOROLAC TROMETHAMINE 10 MG PO TABS: 10.0000 mg | ORAL_TABLET | Freq: Four times a day (QID) | ORAL | 0 refills | Status: AC | PRN

## 2023-12-05 MED ORDER — MUPIROCIN 2 % EX OINT: 1.0000 | TOPICAL_OINTMENT | Freq: Two times a day (BID) | CUTANEOUS | 0 refills | Status: AC

## 2023-12-05 MED ORDER — AMOXICILLIN-POT CLAVULANATE 875-125 MG PO TABS: 1.0000 | ORAL_TABLET | Freq: Two times a day (BID) | ORAL | 0 refills | Status: AC
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
## 2023-12-05 NOTE — ED Provider Notes (Signed)
 [facility]    CSN: [id number] Arrival date & time: 12/05/23  1830      History   Chief Complaint Chief Complaint  Patient presents with   Toe Injury    HPI Kristen Zimmerman is a 48 y.o. female.   HPI Here for pain and swelling and redness of her right great toe.  Her toe started bothering her about 5 days ago, but then on May 17 she dropped a container of Ocean spray on her toe and it hurt some after that though it improved a little bit  .  Then in the last 48 hours she has developed redness and more pain and swelling of the right great toe.  No fever or chills  NKDA   Past Medical History:  Diagnosis Date   Asthma    childhood now resolved    There are no active problems to display for this patient.   History reviewed. No pertinent surgical history.  OB History   No obstetric history on file.      Home Medications    Prior to Admission medications   Medication Sig Start Date End Date Taking? Authorizing Provider  amoxicillin -clavulanate (AUGMENTIN ) 875-125 MG tablet Take 1 tablet by mouth 2 (two) times daily for 7 days. 12/05/23 12/12/23 Yes Kaysey Berndt K, MD  atenolol-chlorthalidone (TENORETIC) 50-25 MG tablet Take 1 tablet by mouth daily. 09/12/23  Yes [provider]  CRESTOR 20 MG tablet 1 tab(s) orally once a day for 90 days   Yes [provider]  hydrochlorothiazide (HYDRODIURIL) 25 MG tablet Take 25 mg by mouth daily. 07/29/22  Yes [provider]  ketorolac  (TORADOL ) 10 MG tablet Take 1 tablet (10 mg total) by mouth every 6 (six) hours as needed (pain). 12/05/23  Yes Alonzo Loving K, MD  mupirocin ointment (BACTROBAN) 2 % Apply 1 Application topically 2 (two) times daily. To affected area till better 12/05/23  Yes Desiderio Dolata, Paige Boatman, MD  pantoprazole  (PROTONIX ) 40 MG tablet Take 40 mg by mouth daily.   Yes [provider]  gabapentin (NEURONTIN) 100 MG capsule Take 100-200 mg by mouth at bedtime.  01/21/21   [provider]  hydrochlorothiazide (HYDRODIURIL) 12.5 MG tablet Take 12.5 mg by mouth daily. 01/21/21   [provider]  ondansetron  (ZOFRAN  ODT) 4 MG disintegrating tablet Take 1 tablet (4 mg total) by mouth every 8 (eight) hours as needed. 09/13/18   McDonald, Benuel Brazier, PA-C    Family History Family History  Problem Relation Age of Onset   Breast cancer Neg Hx     Social History Social History   Tobacco Use   Smoking status: Every Day    Types: Cigarettes   Smokeless tobacco: Never  Vaping Use   Vaping status: Never Used  Substance Use Topics   Alcohol use: Yes    Comment: Occassionally.   Drug use: Yes    Types: Marijuana     Allergies   Patient has no known allergies.   Review of Systems Review of Systems   Physical Exam Triage Vital Signs ED Triage Vitals  Encounter Vitals Group     BP 12/05/23 1915 102/69     Systolic BP Percentile --      Diastolic BP Percentile --      Pulse Rate 12/05/23 1915 92     Resp 12/05/23 1915 18     Temp 12/05/23 1915 98.2 F (36.8 C)     Temp Source 12/05/23 1915 Oral  SpO2 12/05/23 1915 99 %     Weight 12/05/23 1912 174 lb (78.9 kg)     Height 12/05/23 1912 5\' 6"  (1.676 m)     Head Circumference --      Peak Flow --      Pain Score 12/05/23 1908 8     Pain Loc --      Pain Education --      Exclude from Growth Chart --    No data found.  Updated Vital Signs BP 102/69 (BP Location: Left Arm)   Pulse 92   Temp 98.2 F (36.8 C) (Oral)   Resp 18   Ht 5\' 6"  (1.676 m)   Wt 78.9 kg   LMP 09/02/2023 (Exact Date)   SpO2 99%   BMI 28.08 kg/m   Visual Acuity Right Eye Distance:   Left Eye Distance:   Bilateral Distance:    Right Eye Near:   Left Eye Near:    Bilateral Near:     Physical Exam Vitals reviewed.  Constitutional:      General: She is not in acute distress.    Appearance: She is not ill-appearing, toxic-appearing or diaphoretic.  Skin:    Coloration: Skin is not  jaundiced or pale.     Comments: There is erythema and swelling of her right great toe in the distal portion.  In the proximal nail fold in the medial nail fold there is a white and yellow swelling that is consistent with a pus pocket  Neurological:     Mental Status: She is alert.      UC Treatments / Results  Labs (all labs ordered are listed, but only abnormal results are displayed) Labs Reviewed - No data to display  EKG   Radiology No results found.  Procedures Procedures (including critical care time)  Medications Ordered in UC Medications  ketorolac  (TORADOL ) 30 MG/ML injection 30 mg (30 mg Intramuscular Given 12/05/23 1944)    Initial Impression / Assessment and Plan / UC Course  I have reviewed the triage vital signs and the nursing notes.  Pertinent labs & imaging results that were available during my care of the patient were reviewed by me and considered in my medical decision making (see chart for details).      We discussed risks and benefits of drainage of the paronychia and whether or not we should try to do digital anesthesia.  I recommended against it since it is very difficult to get inflamed tissue to anesthetize.  She gave consent for the procedure  .  Under clean conditions pain ease spray was applied to the area and a number 18-gauge was used to make a small stab wound in the nail fold of the right great toe in the medial proximal corner.  Purulent drainage was obtained.  Dressing was applied.  Wound care was discussed.  Toradol  injection is given here for pain and Toradol  tablets are sent to the pharmacy along with Augmentin  and Bactroban.  She is given contact information for podiatry  We do not have x-ray in the building today.  X-rays ordered for outpatient basis.  Her staff will notify her if there is anything significantly abnormal on that. Final Clinical Impressions(s) / UC Diagnoses   Final diagnoses:  Paronychia of toe of right foot   Cellulitis of toe of right foot     Discharge Instructions      Take amoxicillin -clavulanate 875 mg--1 tab twice daily with food for 7 days  Put mupirocin  ointment on the sore areas twice daily until improved  You have been given a shot of Toradol  30 mg today.  Ketorolac  10 mg tablets--take 1 tablet every 6 hours as needed for pain.  This is the same medicine that is in the shot we just gave you  [facility] to have your x-ray done.  Our staff will call you if there is anything significantly abnormal.  ED Prescriptions     Medication Sig Dispense Auth. Provider   ketorolac  (TORADOL ) 10 MG tablet Take 1 tablet (10 mg total) by mouth every 6 (six) hours as needed (pain). 20 tablet Abdullah Rizzi K, MD   amoxicillin -clavulanate (AUGMENTIN ) 875-125 MG tablet Take 1 tablet by mouth 2 (two) times daily for 7 days. 14 tablet Mayu Ronk K, MD   mupirocin ointment (BACTROBAN) 2 % Apply 1 Application topically 2 (two) times daily. To affected area till better 22 g Ellsworth Haas Paige Boatman, MD      PDMP not reviewed this encounter.   Ann Keto, MD 12/05/23 2024

## 2023-12-05 NOTE — Discharge Instructions (Signed)
 Take amoxicillin -clavulanate 875 mg--1 tab twice daily with food for 7 days  Put mupirocin ointment on the sore areas twice daily until improved  You have been given a shot of Toradol  30 mg today.  Ketorolac  10 mg tablets--take 1 tablet every 6 hours as needed for pain.  This is the same medicine that is in the shot we just gave you  [facility] to have your [facility]-ray done.  Our staff will call you if there is anything significantly abnormal.

## 2023-12-07 ENCOUNTER — Ambulatory Visit (HOSPITAL_BASED_OUTPATIENT_CLINIC_OR_DEPARTMENT_OTHER)
Admission: RE | Admit: 2023-12-07 | Discharge: 2023-12-07 | Disposition: A | Discharge status: Home / Self Care | Source: Ambulatory Visit | Attending: Family Medicine | Admitting: Family Medicine

## 2023-12-07 ENCOUNTER — Encounter (HOSPITAL_COMMUNITY): Payer: Self-pay

## 2023-12-07 ENCOUNTER — Ambulatory Visit (HOSPITAL_COMMUNITY)
Admission: RE | Admit: 2023-12-07 | Discharge: 2023-12-07 | Disposition: A | Discharge status: Home / Self Care | Source: Ambulatory Visit | Attending: Family Medicine | Admitting: Family Medicine

## 2023-12-07 ENCOUNTER — Other Ambulatory Visit (HOSPITAL_BASED_OUTPATIENT_CLINIC_OR_DEPARTMENT_OTHER): Payer: Self-pay | Admitting: Family Medicine

## 2023-12-07 DIAGNOSIS — S99921A Unspecified injury of right foot, initial encounter: Secondary | ICD-10-CM | POA: Diagnosis present

## 2023-12-27 ENCOUNTER — Ambulatory Visit (INDEPENDENT_AMBULATORY_CARE_PROVIDER_SITE_OTHER): Admitting: Podiatry

## 2023-12-27 DIAGNOSIS — L603 Nail dystrophy: Secondary | ICD-10-CM

## 2023-12-27 DIAGNOSIS — M778 Other enthesopathies, not elsewhere classified: Secondary | ICD-10-CM | POA: Diagnosis not present

## 2023-12-27 NOTE — Progress Notes (Signed)
 Subjective:  Patient ID: Kristen Zimmerman, female    DOB: Nov 28, 1975,  MRN: [id number]  Chief Complaint  Patient presents with   Toe Pain    48 y.o. female presents with the above complaint.  Patient presents with pain to the right great toe.  Patient states is painful to touch is progressive gotten worse worse with ambulation worse with pressure she went to urgent care.  She states has gotten progressively worse.  She does a lot of stuff on her feet.  She also had a nail injury to the great toe.  She has not seen anyone as prior to seeing me denies any other acute complaints would like to discuss treatment options for that as well.   Review of Systems: Negative except as noted in the HPI. Denies N/V/F/Ch.  Past Medical History:  Diagnosis Date   Asthma    childhood now resolved    Current Outpatient Medications:    atenolol-chlorthalidone (TENORETIC) 50-25 MG tablet, Take 1 tablet by mouth daily., Disp: , Rfl:    CRESTOR 20 MG tablet, 1 tab(s) orally once a day for 90 days, Disp: , Rfl:    gabapentin (NEURONTIN) 100 MG capsule, Take 100-200 mg by mouth at bedtime., Disp: , Rfl:    hydrochlorothiazide (HYDRODIURIL) 12.5 MG tablet, Take 12.5 mg by mouth daily., Disp: , Rfl:    hydrochlorothiazide (HYDRODIURIL) 25 MG tablet, Take 25 mg by mouth daily., Disp: , Rfl:    ketorolac  (TORADOL ) 10 MG tablet, Take 1 tablet (10 mg total) by mouth every 6 (six) hours as needed (pain)., Disp: 20 tablet, Rfl: 0   mupirocin  ointment (BACTROBAN ) 2 %, Apply 1 Application topically 2 (two) times daily. To affected area till better, Disp: 22 g, Rfl: 0   ondansetron  (ZOFRAN  ODT) 4 MG disintegrating tablet, Take 1 tablet (4 mg total) by mouth every 8 (eight) hours as needed., Disp: 20 tablet, Rfl: 0   pantoprazole  (PROTONIX ) 40 MG tablet, Take 40 mg by mouth daily., Disp: , Rfl:   Social History   Tobacco Use  Smoking Status Every Day   Types: Cigarettes  Smokeless Tobacco Never    No Known  Allergies Objective:  There were no vitals filed for this visit. There is no height or weight on file to calculate BMI. Constitutional Well developed. Well nourished.  Vascular Dorsalis pedis pulses palpable bilaterally. Posterior tibial pulses palpable bilaterally. Capillary refill normal to all digits.  No cyanosis or clubbing noted. Pedal hair growth normal.  Neurologic Normal speech. Oriented to person, place, and time. Epicritic sensation to light touch grossly present bilaterally.  Dermatologic Right hallux nail injury noted mild hematoma noted.  Nail still well adhered to the underlying nailbed.  No signs of infection No open wounds. No skin lesions.  Orthopedic: Pain with resisted dorsiflexion of the hallux.  No pain with plantarflexion of the hallux.  Pain along the course of her left extensor hallucis longus tendon.  Some pain along the extensor digitorum longus tendons.    Radiographs: None Assessment:   1. Extensor tendinitis of foot   2. Nail dystrophy    Plan:  Patient was evaluated and treated and all questions answered.  Right extensor tendinitis foot - All questions and concerns were discussed with the patient in extensive detail given the amount of pain that she is having should benefit from cam boot immobilization - Cam boot was dispensed  Right hallux nail injury - All questions and concerns were discussed with the patient in extensive  detail at this time the nail appears to be adhered to the underlying nailbed but if it regresses we will plan on removing the nail during next visit.  I discussed this with the patient she states understanding.  No follow-ups on file.

## 2024-01-24 ENCOUNTER — Ambulatory Visit (INDEPENDENT_AMBULATORY_CARE_PROVIDER_SITE_OTHER): Admitting: Podiatry

## 2024-01-24 DIAGNOSIS — L600 Ingrowing nail: Secondary | ICD-10-CM

## 2024-01-24 NOTE — Progress Notes (Signed)
 Subjective:  Patient ID: Kristen Zimmerman, female    DOB: 11-Aug-1975,  MRN: [id number]  Chief Complaint  Patient presents with   Nail Problem    Pt stated that she is having pain with her big toe     48 y.o. female presents with the above complaint.  Patient presents with complaint of right hallux medial border ingrown.  Patient states is painful to touch is progressive gotten worse worse with ambulation or shoe pressure she would like to discuss treatment options she would like to have it removed denies any other acute complaints   Review of Systems: Negative except as noted in the HPI. Denies N/V/F/Ch.  Past Medical History:  Diagnosis Date   Asthma    childhood now resolved    Current Outpatient Medications:    atenolol-chlorthalidone (TENORETIC) 50-25 MG tablet, Take 1 tablet by mouth daily., Disp: , Rfl:    CRESTOR 20 MG tablet, 1 tab(s) orally once a day for 90 days, Disp: , Rfl:    gabapentin (NEURONTIN) 100 MG capsule, Take 100-200 mg by mouth at bedtime., Disp: , Rfl:    hydrochlorothiazide (HYDRODIURIL) 12.5 MG tablet, Take 12.5 mg by mouth daily., Disp: , Rfl:    hydrochlorothiazide (HYDRODIURIL) 25 MG tablet, Take 25 mg by mouth daily., Disp: , Rfl:    ketorolac  (TORADOL ) 10 MG tablet, Take 1 tablet (10 mg total) by mouth every 6 (six) hours as needed (pain)., Disp: 20 tablet, Rfl: 0   mupirocin  ointment (BACTROBAN ) 2 %, Apply 1 Application topically 2 (two) times daily. To affected area till better, Disp: 22 g, Rfl: 0   ondansetron  (ZOFRAN  ODT) 4 MG disintegrating tablet, Take 1 tablet (4 mg total) by mouth every 8 (eight) hours as needed., Disp: 20 tablet, Rfl: 0   pantoprazole  (PROTONIX ) 40 MG tablet, Take 40 mg by mouth daily., Disp: , Rfl:   Social History   Tobacco Use  Smoking Status Every Day   Types: Cigarettes  Smokeless Tobacco Never    No Known Allergies Objective:  There were no vitals filed for this visit. There is no height or weight on file  to calculate BMI. Constitutional Well developed. Well nourished.  Vascular Dorsalis pedis pulses palpable bilaterally. Posterior tibial pulses palpable bilaterally. Capillary refill normal to all digits.  No cyanosis or clubbing noted. Pedal hair growth normal.  Neurologic Normal speech. Oriented to person, place, and time. Epicritic sensation to light touch grossly present bilaterally.  Dermatologic Painful ingrowing nail at medial nail borders of the hallux nail right. No other open wounds. No skin lesions.  Orthopedic: Normal joint ROM without pain or crepitus bilaterally. No visible deformities. No bony tenderness.   Radiographs: None Assessment:   1. Ingrown toenail of right foot    Plan:  Patient was evaluated and treated and all questions answered.  Ingrown Nail, right -Patient elects to proceed with minor surgery to remove ingrown toenail removal today. Consent reviewed and signed by patient. -Ingrown nail excised. See procedure note. -Educated on post-procedure care including soaking. Written instructions provided and reviewed. -Patient to follow up in 2 weeks for nail check.  Procedure: Excision of Ingrown Toenail Location: Right 1st toe medial nail borders. Anesthesia: Lidocaine  1% plain; 1.5 mL and Marcaine 0.5% plain; 1.5 mL, digital block. Skin Prep: Betadine. Dressing: Silvadene; telfa; dry, sterile, compression dressing. Technique: Following skin prep, the toe was exsanguinated and a tourniquet was secured at the base of the toe. The affected nail border was freed, split with a  nail splitter, and excised. Chemical matrixectomy was then performed with phenol and irrigated out with alcohol. The tourniquet was then removed and sterile dressing applied. Disposition: Patient tolerated procedure well. Patient to return in 2 weeks for follow-up.   No follow-ups on file.

## 2024-05-10 ENCOUNTER — Other Ambulatory Visit: Payer: Self-pay | Admitting: Medical Genetics

## 2024-05-16 ENCOUNTER — Ambulatory Visit
Admission: RE | Admit: 2024-05-16 | Discharge: 2024-05-16 | Disposition: A | Discharge status: Home / Self Care | Source: Ambulatory Visit | Attending: Physician Assistant | Admitting: Physician Assistant

## 2024-05-16 DIAGNOSIS — N6310 Unspecified lump in the right breast, unspecified quadrant: Secondary | ICD-10-CM

## 2024-06-20 ENCOUNTER — Other Ambulatory Visit

## 2024-07-23 ENCOUNTER — Other Ambulatory Visit: Payer: Self-pay | Admitting: Medical Genetics

## 2024-07-23 DIAGNOSIS — Z006 Encounter for examination for normal comparison and control in clinical research program: Secondary | ICD-10-CM
